# Patient Record
Sex: Male | Born: 1975 | Race: White | Hispanic: No | Marital: Married | Smoking: Current every day smoker
Health system: Southern US, Community
[De-identification: ages and names within clinical notes are randomized; demographics above are authoritative.]

## PROBLEM LIST (undated history)

## (undated) DIAGNOSIS — R51 Headache: Secondary | ICD-10-CM

## (undated) DIAGNOSIS — K219 Gastro-esophageal reflux disease without esophagitis: Secondary | ICD-10-CM

## (undated) DIAGNOSIS — E669 Obesity, unspecified: Secondary | ICD-10-CM

## (undated) DIAGNOSIS — I10 Essential (primary) hypertension: Secondary | ICD-10-CM

## (undated) DIAGNOSIS — F17200 Nicotine dependence, unspecified, uncomplicated: Secondary | ICD-10-CM

## (undated) DIAGNOSIS — F419 Anxiety disorder, unspecified: Secondary | ICD-10-CM

## (undated) DIAGNOSIS — R740 Nonspecific elevation of levels of transaminase and lactic acid dehydrogenase [LDH]: Secondary | ICD-10-CM

## (undated) DIAGNOSIS — E785 Hyperlipidemia, unspecified: Secondary | ICD-10-CM

## (undated) DIAGNOSIS — R7401 Elevation of levels of liver transaminase levels: Secondary | ICD-10-CM

## (undated) DIAGNOSIS — E1165 Type 2 diabetes mellitus with hyperglycemia: Secondary | ICD-10-CM

## (undated) DIAGNOSIS — Z8669 Personal history of other diseases of the nervous system and sense organs: Secondary | ICD-10-CM

## (undated) DIAGNOSIS — R519 Headache, unspecified: Secondary | ICD-10-CM

## (undated) HISTORY — PX: NO PAST SURGERIES: SHX2092

## (undated) HISTORY — DX: Gastro-esophageal reflux disease without esophagitis: K21.9

## (undated) HISTORY — DX: Anxiety disorder, unspecified: F41.9

## (undated) HISTORY — DX: Elevation of levels of liver transaminase levels: R74.01

## (undated) HISTORY — DX: Nonspecific elevation of levels of transaminase and lactic acid dehydrogenase (ldh): R74.0

## (undated) HISTORY — DX: Nicotine dependence, unspecified, uncomplicated: F17.200

## (undated) HISTORY — DX: Headache, unspecified: R51.9

## (undated) HISTORY — DX: Personal history of other diseases of the nervous system and sense organs: Z86.69

## (undated) HISTORY — DX: Essential (primary) hypertension: I10

## (undated) HISTORY — DX: Hyperlipidemia, unspecified: E78.5

## (undated) HISTORY — DX: Obesity, unspecified: E66.9

## (undated) HISTORY — DX: Headache: R51

## (undated) HISTORY — DX: Type 2 diabetes mellitus with hyperglycemia: E11.65

---

## 2006-10-08 ENCOUNTER — Ambulatory Visit: Payer: Self-pay | Admitting: Internal Medicine

## 2009-10-13 DIAGNOSIS — Z8669 Personal history of other diseases of the nervous system and sense organs: Secondary | ICD-10-CM

## 2009-10-13 HISTORY — DX: Personal history of other diseases of the nervous system and sense organs: Z86.69

## 2011-12-28 ENCOUNTER — Emergency Department: Payer: Self-pay | Admitting: Internal Medicine

## 2011-12-28 LAB — COMPREHENSIVE METABOLIC PANEL
ALT: 78 U/L — AB (ref 10–40)
AST: 43 U/L
Albumin: 4
Alkaline Phosphatase: 69 U/L (ref 50–136)
BUN: 14 mg/dL (ref 7–18)
Bilirubin,Total: 0.3 mg/dL (ref 0.2–1.0)
Chloride: 104 mmol/L (ref 98–107)
Creatinine: 0.94 mg/dL (ref 0.60–1.30)
EGFR (African American): >60
Glucose: 106 mg/dL — ABNORMAL HIGH (ref 65–99)
Osmolality: 288 (ref 275–301)
Potassium: 3.5 mmol/L (ref 3.5–5.1)
SGPT (ALT): 78 U/L
Sodium: 144 mmol/L (ref 136–145)
Total Bilirubin: 0.3 mg/dL
Total Protein: 7.7 g/dL (ref 6.4–8.2)

## 2011-12-28 LAB — CBC
Hemoglobin: 15 g/dL (ref 13.5–17.5)
MCH: 32.6 pg (ref 26.0–34.0)
MCHC: 34.2 g/dL (ref 32.0–36.0)
Platelet: 216 10*3/uL (ref 150–440)
RBC: 4.6 10*6/uL (ref 4.40–5.90)
RDW: 13.1 % (ref 11.5–14.5)
WBC: 10.4 10*3/uL (ref 3.8–10.6)
platelet count: 216

## 2011-12-28 LAB — TROPONIN I: Troponin-I: <0.02 ng/mL

## 2011-12-31 ENCOUNTER — Ambulatory Visit (INDEPENDENT_AMBULATORY_CARE_PROVIDER_SITE_OTHER): Payer: BC Managed Care – PPO | Admitting: Family Medicine

## 2011-12-31 ENCOUNTER — Encounter: Payer: Self-pay | Admitting: Family Medicine

## 2011-12-31 VITALS — BP 140/100 | HR 84 | Temp 98.8°F | Ht 70.0 in | Wt 257.5 lb

## 2011-12-31 DIAGNOSIS — R0981 Nasal congestion: Secondary | ICD-10-CM | POA: Insufficient documentation

## 2011-12-31 DIAGNOSIS — R51 Headache: Secondary | ICD-10-CM

## 2011-12-31 DIAGNOSIS — J3489 Other specified disorders of nose and nasal sinuses: Secondary | ICD-10-CM

## 2011-12-31 DIAGNOSIS — I10 Essential (primary) hypertension: Secondary | ICD-10-CM

## 2011-12-31 DIAGNOSIS — K219 Gastro-esophageal reflux disease without esophagitis: Secondary | ICD-10-CM | POA: Insufficient documentation

## 2011-12-31 DIAGNOSIS — E669 Obesity, unspecified: Secondary | ICD-10-CM

## 2011-12-31 DIAGNOSIS — F17200 Nicotine dependence, unspecified, uncomplicated: Secondary | ICD-10-CM

## 2011-12-31 MED ORDER — AMLODIPINE BESYLATE 5 MG PO TABS: 5.0000 mg | ORAL_TABLET | Freq: Every day | ORAL | Status: DC

## 2011-12-31 MED ORDER — OMEPRAZOLE 40 MG PO CPDR: 40.0000 mg | DELAYED_RELEASE_CAPSULE | Freq: Every day | ORAL | Status: DC

## 2011-12-31 NOTE — Assessment & Plan Note (Signed)
Trial of antihistamine.  If not improving to let us know.

## 2011-12-31 NOTE — Assessment & Plan Note (Signed)
Encouraged continued cessation, discussed different methods. Doing well on his own.

## 2011-12-31 NOTE — Progress Notes (Signed)
Subjective:    Patient ID: Brian Watson, male    DOB: January 02, 1976, 36 y.o.   MRN: [id number]  HPI CC: new pt to establish  Sunday had gassy bloated feeling in chest (improved with belching) so went to Er to be evaluated.  BP up to 200 systolic, then dropped to 190/90.  Unable to stay for evaluation because had to go home and take care of children.  Awaiting ER records.  States normal EKG then.  Elevated BP - started noticing issues when started cutting back on smoking - about 3 wks ago.  Tends to get frequent headaches.  Never on BP meds in past.  Denies CP/tightness, SOB, leg swelling.  Reflux - 1 year h/o indigestion with gassiness, worse with eating right before bedtime.  Notes worse with marinara.  Usually controlled well with zantac bid scheduled.  Worsening sinus congestion over last 2-3 days.  Mainly rhinorrhea, some coryza.    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  Having episodes where feels "nervousness" that improves with removing himself from stressful situation and relaxing.    Stress and bad memories with coming to doctor's office as father was sick and didn't take care of himself, in and out of hospitals, died age 64yo.  H/o bell's palsy 2011 - saw Dr. Jenne Campus ENT, placed on steroids and sxs resolved.  Smoking - trying to quit, down to 4 cig/day.  Prior 1 ppd.  Motivated to quit 2/2 cost of cigarettes and extra premium he pays as a smoker.  Obesity - Body mass index is 36.95 kg/(m^2).  Tries to get good fruit intake.  Stays more active during summer.  Wife says he snores.  Some days sleeps well, other days light sleeping.  Preventative: No recent CPE. No recent blood work.  Caffeine: 1 cup coffee/day Lives with wife and 2 children and mother, 1 dog Occupation: self employed Holiday representative Edu: HS Activity: hunts, fishes, gardens Diet: tries to keep good diet - daily fruits/vegetables, good water intake,  at work poor diet.  Medications and allergies reviewed and updated in chart.  Past histories reviewed and updated if relevant as below. Patient Active Problem List  Diagnoses  . Generalized headaches  . Smoker  . HTN (hypertension)  . GERD (gastroesophageal reflux disease)  . Obesity   Past Medical History  Diagnosis Date  . Generalized headaches     frequent  . GERD (gastroesophageal reflux disease)   . HTN (hypertension)   . H/O Bell's palsy 2011  . Smoker     cutting back  . Obesity    No past surgical history on file. History  Substance Use Topics  . Smoking status: Current Everyday Smoker -- 0.3 packs/day    Types: Cigarettes  . Smokeless tobacco: Never Used  . Alcohol Use: Yes     Daily (2 drinks after work)   Family History  Problem Relation Age of Onset  . Hypertension Mother   . Hyperlipidemia Mother   . Diabetes Father   . Hyperlipidemia Father   . Hypertension Father   . Coronary artery disease Paternal Grandfather   . Stroke Neg Hx   . Cancer Neg Hx    No Known Allergies No current outpatient prescriptions on file prior to visit.   Review of Systems  Constitutional: Negative for fever, chills, activity change, appetite change, fatigue and unexpected weight change.  HENT: Negative for hearing loss and neck pain.   Respiratory: Negative for cough, chest  tightness, shortness of breath and wheezing.   Cardiovascular: Negative for chest pain, palpitations and leg swelling.  Gastrointestinal: Negative for nausea, vomiting, abdominal pain, diarrhea, constipation, blood in stool and abdominal distention.  Genitourinary: Negative for hematuria and difficulty urinating.  Musculoskeletal: Negative for myalgias and arthralgias.  Skin: Negative for rash.  Neurological: Positive for headaches. Negative for dizziness, seizures and syncope.  Hematological: Does not bruise/bleed easily.  Psychiatric/Behavioral: Negative for dysphoric mood. The patient is not  nervous/anxious.        Objective:   Physical Exam  Nursing note and vitals reviewed. Constitutional: He is oriented to person, place, and time. He appears well-developed and well-nourished. No distress.       obese  HENT:  Head: Normocephalic and atraumatic.  Right Ear: Hearing, tympanic membrane, external ear and ear canal normal.  Left Ear: Hearing, tympanic membrane, external ear and ear canal normal.  Nose: Rhinorrhea present. No mucosal edema. Right sinus exhibits no maxillary sinus tenderness and no frontal sinus tenderness. Left sinus exhibits no maxillary sinus tenderness and no frontal sinus tenderness.  Mouth/Throat: Uvula is midline, oropharynx is clear and moist and mucous membranes are normal. No oropharyngeal exudate, posterior oropharyngeal edema, posterior oropharyngeal erythema or tonsillar abscesses.  Eyes: Conjunctivae and EOM are normal. Pupils are equal, round, and reactive to light. No scleral icterus.  Neck: Normal range of motion. Neck supple. No thyromegaly present.  Cardiovascular: Normal rate, regular rhythm, normal heart sounds and intact distal pulses.   No murmur heard. Pulses:      Radial pulses are 2+ on the right side, and 2+ on the left side.  Pulmonary/Chest: Effort normal and breath sounds normal. No respiratory distress. He has no wheezes. He has no rales.  Musculoskeletal: Normal range of motion. He exhibits no edema.  Lymphadenopathy:    He has no cervical adenopathy.  Neurological: He is alert and oriented to person, place, and time.       CN grossly intact, station and gait intact  Skin: Skin is warm and dry. No rash noted.  Psychiatric: He has a normal mood and affect. His behavior is normal. Judgment and thought content normal.       Assessment & Plan:

## 2011-12-31 NOTE — Assessment & Plan Note (Signed)
Encouraged continued diet changes, lifestyle changes to bring about sustainable weight loss. Discussed goal 5-10% weight loss for overall improvement in health. Pt motivated to work on this - goal 220lbs

## 2011-12-31 NOTE — Assessment & Plan Note (Addendum)
Relatively new dx. Several elevated readings, some up to 200s systolic (at [facility] ER).  Given elevated again today, start amlodipine at 5mg  daily - discussed common side effects. Pending records to review and will scan EKG if good quality. Will input labs from [facility] as well and see what is needed to draw at next visit. Discussed importance of weight loss and staying active and high potasium diet. rtc 1 mo for f/u.  ADDENDUM==> received records from [facility] eval - EKG NSR, CXR WNL, just received report of EKG not actual EKG, will ask to input labs - overall normal (glu 106, AST 43.) will rpt EKG at [facility] for baseline.

## 2011-12-31 NOTE — Assessment & Plan Note (Signed)
Discussed gerd precautions. Start prilosec daily for 3-4 wks, then reassess.

## 2011-12-31 NOTE — Patient Instructions (Addendum)
Return in 2 weeks for fasting lab visit, in 1 month for follow up blood pressure and reflux. Start amlodipine 5mg  for blood pressure. Start prilosec 40mg  daily for reflux for 3-4 weeks. For sinuses - I recommend claritin or zyrtec to dry up runny nose.  If not improving let me know. Good to meet you today, call us with questions.

## 2012-01-07 ENCOUNTER — Encounter: Payer: Self-pay | Admitting: Family Medicine

## 2012-01-13 ENCOUNTER — Other Ambulatory Visit: Payer: Self-pay | Admitting: Family Medicine

## 2012-01-13 DIAGNOSIS — I10 Essential (primary) hypertension: Secondary | ICD-10-CM

## 2012-01-14 ENCOUNTER — Other Ambulatory Visit (INDEPENDENT_AMBULATORY_CARE_PROVIDER_SITE_OTHER): Payer: BC Managed Care – PPO

## 2012-01-14 DIAGNOSIS — I10 Essential (primary) hypertension: Secondary | ICD-10-CM

## 2012-01-14 LAB — LIPID PANEL: HDL: 41.1 mg/dL (ref >39.00)

## 2012-01-14 LAB — LDL CHOLESTEROL, DIRECT: Direct LDL: 126.5 mg/dL

## 2012-01-19 ENCOUNTER — Ambulatory Visit (INDEPENDENT_AMBULATORY_CARE_PROVIDER_SITE_OTHER): Payer: BC Managed Care – PPO | Admitting: Family Medicine

## 2012-01-19 ENCOUNTER — Telehealth: Payer: Self-pay | Admitting: Family Medicine

## 2012-01-19 ENCOUNTER — Encounter: Payer: Self-pay | Admitting: Family Medicine

## 2012-01-19 VITALS — BP 148/98 | HR 96 | Temp 98.7°F | Wt 249.2 lb

## 2012-01-19 DIAGNOSIS — G479 Sleep disorder, unspecified: Secondary | ICD-10-CM

## 2012-01-19 DIAGNOSIS — G4733 Obstructive sleep apnea (adult) (pediatric): Secondary | ICD-10-CM | POA: Insufficient documentation

## 2012-01-19 DIAGNOSIS — I10 Essential (primary) hypertension: Secondary | ICD-10-CM

## 2012-01-19 DIAGNOSIS — G478 Other sleep disorders: Secondary | ICD-10-CM

## 2012-01-19 DIAGNOSIS — K7581 Nonalcoholic steatohepatitis (NASH): Secondary | ICD-10-CM | POA: Insufficient documentation

## 2012-01-19 DIAGNOSIS — E785 Hyperlipidemia, unspecified: Secondary | ICD-10-CM

## 2012-01-19 MED ORDER — AMLODIPINE BESYLATE 10 MG PO TABS: 10.0000 mg | ORAL_TABLET | Freq: Every day | ORAL | Status: DC

## 2012-01-19 NOTE — Progress Notes (Signed)
  Subjective:    Patient ID: Dezmond Downie, male    DOB: [birth year], 36 y.o.   MRN: [id number]  HPI CC: continued fatigue  last 1-2 wks trouble sleeping.  Tossing and turning.  Feels like constantly dreaming, light sleeping more than normal.  On average falls asleep in 30 min.  No trouble with staying asleep.  Bedtime around 10pm.  Heavy snoring, doesn't feel rested when awakens.  No noted apnea.  No PNdyspnea.  No daytime somnolence.  Possible excessive worrying, but mainly about recent HTN dx.  BP Readings from Last 3 Encounters:  01/19/12 148/98  12/31/11 140/100   Wt Readings from Last 3 Encounters:  01/19/12 249 lb 4 oz (113.059 kg)  12/31/11 257 lb 8 oz (116.801 kg)  extensive lifestyle changes over last 2 weeks.  Walking a lot more and regularly.  Down 10 lbs.  Only drinking water.  Rare sodas (prior was drinking 3 small bottles of mountain dew/day).  Stopped drinking cold [place] (prior drinking 3-4 mixed drinks or wine).  Smoking 2-4 cigarettes per day.  Changed diet from beef/pork to fish/chicken.  Review of Systems Per HPI    Objective:   Physical Exam  Nursing note and vitals reviewed. Constitutional: He appears well-developed and well-nourished. No distress.  HENT:  Head: Normocephalic and atraumatic.  Mouth/Throat: Oropharynx is clear and moist. No oropharyngeal exudate.  Eyes: Conjunctivae and EOM are normal. Pupils are equal, round, and reactive to light. No scleral icterus.  Neck: Normal range of motion. Neck supple. Carotid bruit is not present.  Cardiovascular: Normal rate, regular rhythm, normal heart sounds and intact distal pulses.   No murmur heard. Pulmonary/Chest: Breath sounds normal. No respiratory distress. He has no wheezes. He has no rales.  Musculoskeletal: He exhibits no edema.  Skin: Skin is warm and dry. No rash noted.       Assessment & Plan:

## 2012-01-19 NOTE — Telephone Encounter (Signed)
Caller: Chris/Patient; PCP: Eustaquio Boyden; CB#: [phone]; Call regarding Tired, Feels Drained, Occasionally "Overwhelmed"; Not Sleeping Well -"A lot of things on my mind."  Relates this has started since he changed his diet and exercise, medications.  Change in ability to perform activities of daily living and not previously evaluated.    Appt. w/ PCP at 1615 on 01/19/12.

## 2012-01-19 NOTE — Assessment & Plan Note (Signed)
With increased anxiousness, mild tachycardia x 1 wk. Correlates roughly to when stopped EtOH cold [place].  ?mild withdrawal. Recommend restart 1-2 drinks daily (was at 4+ drinks daily) and slowly taper down (pt wants to stop EtOH completely.) Doubt due to amlodipine.

## 2012-01-19 NOTE — Assessment & Plan Note (Signed)
Elevated ALT at ER. Recheck in future.  If staying elevated, further workup indicated.

## 2012-01-19 NOTE — Assessment & Plan Note (Signed)
Reviewed blood work in detail, discussed dietary changes for lowering cholesterol levels.

## 2012-01-19 NOTE — Patient Instructions (Signed)
Good to see you today. Return in 1 month (cancel appt 4/22), sooner if needed. Increase amlodipine to 5mg  twice daily, new prescription will be for 10 mg daily. Call us with questions.

## 2012-01-19 NOTE — Assessment & Plan Note (Signed)
Not at goal.  Increase amlodipine to 10mg  daily. Return in 34mo for f/u. Will need baseline EKG for our records in future. Reviewed blood work in detail.

## 2012-01-19 NOTE — Telephone Encounter (Signed)
I will see today

## 2012-01-28 ENCOUNTER — Ambulatory Visit (INDEPENDENT_AMBULATORY_CARE_PROVIDER_SITE_OTHER): Payer: BC Managed Care – PPO | Admitting: Family Medicine

## 2012-01-28 ENCOUNTER — Encounter: Payer: Self-pay | Admitting: Family Medicine

## 2012-01-28 ENCOUNTER — Telehealth: Payer: Self-pay | Admitting: Family Medicine

## 2012-01-28 VITALS — BP 138/90 | HR 72 | Temp 98.3°F | Wt 246.8 lb

## 2012-01-28 DIAGNOSIS — K219 Gastro-esophageal reflux disease without esophagitis: Secondary | ICD-10-CM

## 2012-01-28 DIAGNOSIS — R0789 Other chest pain: Secondary | ICD-10-CM

## 2012-01-28 NOTE — Assessment & Plan Note (Signed)
Likely source of atypical chest pain.  Reassuring history, better with pepto, no exertional sx, EGK w/o sig abnormality.  PAC noted on EGK.  No indication for further w/u at this point.  Work stress likely plays a role. Will notify PCP. If records not yet received from [facility], will request for PCP. Pt reassured.

## 2012-01-28 NOTE — Telephone Encounter (Signed)
Caller: Chris/Patient; PCP: Eustaquio Boyden; CB#: [phone]; ; Call regarding Acid Reflux; takes prilosec which was prescribed by MD but not helping very much with symptoms.  Pt wants to come back in and talk to MD about possibly switching to another medication.  Denies need for triage assessment.  No appts with Dr. Sharen Hones.  Appt scheduled for today at 2:15 PM with Dr. Para March per pt request.

## 2012-01-28 NOTE — Patient Instructions (Signed)
I wouldn't change your meds.  Take pepto as needed and keep working on your diet. I'll talk with Dr. Reece Agar in the meantime.  Take care.

## 2012-01-28 NOTE — Telephone Encounter (Signed)
Noted. Pt will see Dr. Algis Downs today.

## 2012-01-28 NOTE — Progress Notes (Signed)
H/o GERD.  Had been on H2 blocker prev, now on PPI.  Extensive lifestyle changes recently.  He has cut back to 5 cigs a day and had cut back to 2 drinks in a day.  His sleep is some better in the meantime.  Still with a sensation intermittently in his chest.  Can had chest pain at rest.  It got better with pepto bismol.  His father died at 78 but was chronically in poor healthy.    He doesn't have exertional sx. The feeling he notes now may have been present in a longer, but seems worse recently.  He's been walking more, up to 18 miles a week.  He doesn't have SOB or CP with that.    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    Meds, vitals, and allergies reviewed.   ROS: See HPI.  Otherwise, noncontributory.  GEN: nad, alert and oriented HEENT: mucous membranes moist NECK: supple w/o LA CV: rrr.  no murmur PULM: ctab, no inc wob ABD: soft, +bs EXT: no edema SKIN: no acute rash  EGK reviewed.

## 2012-02-02 ENCOUNTER — Ambulatory Visit: Payer: BC Managed Care – PPO | Admitting: Family Medicine

## 2012-02-13 ENCOUNTER — Ambulatory Visit: Payer: Self-pay | Admitting: Family Medicine

## 2012-02-18 ENCOUNTER — Ambulatory Visit (INDEPENDENT_AMBULATORY_CARE_PROVIDER_SITE_OTHER): Payer: BC Managed Care – PPO | Admitting: Family Medicine

## 2012-02-18 ENCOUNTER — Encounter: Payer: Self-pay | Admitting: Family Medicine

## 2012-02-18 VITALS — BP 136/82 | HR 76 | Temp 98.1°F | Wt 249.2 lb

## 2012-02-18 DIAGNOSIS — E785 Hyperlipidemia, unspecified: Secondary | ICD-10-CM

## 2012-02-18 DIAGNOSIS — K219 Gastro-esophageal reflux disease without esophagitis: Secondary | ICD-10-CM

## 2012-02-18 DIAGNOSIS — F17200 Nicotine dependence, unspecified, uncomplicated: Secondary | ICD-10-CM

## 2012-02-18 DIAGNOSIS — I10 Essential (primary) hypertension: Secondary | ICD-10-CM

## 2012-02-18 NOTE — Assessment & Plan Note (Addendum)
Encouraged continued cessation. Discussed nicotine gum. Pt thinks will buy e cig to try.

## 2012-02-18 NOTE — Assessment & Plan Note (Signed)
Recheck after 6 mo TLC - which he has taken to heart.

## 2012-02-18 NOTE — Assessment & Plan Note (Signed)
Chronic, stable. Good control - continue norvasc.

## 2012-02-18 NOTE — Assessment & Plan Note (Signed)
Elevated at hospital.  Next blood work next visit recheck.  If remains elevated, check abd [place].

## 2012-02-18 NOTE — Progress Notes (Signed)
  Subjective:    Patient ID: Brian Watson, male    DOB: [birth year], 36 y.o.   MRN: [id number]  HPI CC: 1 mo f/u  HTN - tolerating bp med well.  No leg or ankle swelling .  No HA, vision changes, CP/tightness, SOB, leg swelling.   Chest discomfort better - no more sharp pains.  Added maalox in middle of day.  Taking omeprazole 30 min prior to meal now.  Minimal GERD sxs. BP Readings from Last 3 Encounters:  02/18/12 136/82  01/28/12 138/90  01/19/12 148/98   Sticking with diet.  No beef, no pork.  Today ate fiber one, pistachios for snack, tuna on wheat bread.  Tonight baked fish and asparagus.  Changed to [place] bacon and sausage.    Wt Readings from Last 3 Encounters:  02/18/12 249 lb 4 oz (113.059 kg)  01/28/12 246 lb 12 oz (111.925 kg)  01/19/12 249 lb 4 oz (113.059 kg)   Having shoulder pain that started over weekend.  Started after carrying cooler full of fish.  Worse pain with lifting arm above head.  Taking ibuprofen for this and improving with time.  Lab Results  Component Value Date   ALT 78* 12/28/2011   AST 43 12/28/2011   ALKPHOS 69 12/28/2011   BILITOT 0.3 12/28/2011    Review of Systems Per HPI    Objective:   Physical Exam  Nursing note and vitals reviewed. Constitutional: He appears well-developed and well-nourished. No distress.  HENT:  Head: Normocephalic and atraumatic.  Mouth/Throat: Oropharynx is clear and moist. No oropharyngeal exudate.  Eyes: Conjunctivae and EOM are normal. Pupils are equal, round, and reactive to light. No scleral icterus.  Neck: Normal range of motion. Neck supple. Carotid bruit is not present.  Cardiovascular: Normal rate, regular rhythm, normal heart sounds and intact distal pulses.   No murmur heard. Pulmonary/Chest: Breath sounds normal. No respiratory distress. He has no wheezes. He has no rales.  Musculoskeletal:       No pain with palpation of shoulder. Mild tenderness with empty can sign on left.  Mild  tenderness with testing of SITS with internal rotation.  Skin: Skin is warm and dry. No rash noted.       Assessment & Plan:

## 2012-02-18 NOTE — Patient Instructions (Signed)
Keep an eye on blood pressure. Return in October for blood work fasting and afterwards for office visit to discuss. Continue meds as up to now, call us with quesitons. Great to see you today!

## 2012-02-18 NOTE — Assessment & Plan Note (Signed)
Thought contributing to chest pain, with better GERD control chest discomfort has resolved. Taking PPI daily 30 min prior to meals and maalox prn.

## 2012-03-09 ENCOUNTER — Ambulatory Visit (INDEPENDENT_AMBULATORY_CARE_PROVIDER_SITE_OTHER): Payer: BC Managed Care – PPO | Admitting: Family Medicine

## 2012-03-09 ENCOUNTER — Encounter: Payer: Self-pay | Admitting: Family Medicine

## 2012-03-09 VITALS — BP 124/90 | HR 76 | Temp 98.2°F | Wt 241.8 lb

## 2012-03-09 DIAGNOSIS — I10 Essential (primary) hypertension: Secondary | ICD-10-CM

## 2012-03-09 DIAGNOSIS — F419 Anxiety disorder, unspecified: Secondary | ICD-10-CM

## 2012-03-09 DIAGNOSIS — E785 Hyperlipidemia, unspecified: Secondary | ICD-10-CM

## 2012-03-09 DIAGNOSIS — F17200 Nicotine dependence, unspecified, uncomplicated: Secondary | ICD-10-CM

## 2012-03-09 DIAGNOSIS — R0789 Other chest pain: Secondary | ICD-10-CM

## 2012-03-09 DIAGNOSIS — G479 Sleep disorder, unspecified: Secondary | ICD-10-CM

## 2012-03-09 DIAGNOSIS — F411 Generalized anxiety disorder: Secondary | ICD-10-CM

## 2012-03-09 DIAGNOSIS — K219 Gastro-esophageal reflux disease without esophagitis: Secondary | ICD-10-CM

## 2012-03-09 DIAGNOSIS — G478 Other sleep disorders: Secondary | ICD-10-CM

## 2012-03-09 HISTORY — DX: Anxiety disorder, unspecified: F41.9

## 2012-03-09 MED ORDER — BUSPIRONE HCL 7.5 MG PO TABS: 7.5000 mg | ORAL_TABLET | Freq: Two times a day (BID) | ORAL | Status: DC

## 2012-03-09 NOTE — Patient Instructions (Addendum)
We will call you with referral to heart doctor and to lung doctor for sleep study. Start buspar at 7.5mg  nightly, may go up to 7.5mg  twice daily after 4-5 days if tolerated - watch for dizziness Return to see me in 1 month to see how buspar is doing.

## 2012-03-09 NOTE — Assessment & Plan Note (Addendum)
GAD7 = 15/21 Anticipate significant component of issues stemming from recent increase in anxiety. Discussed this, will treat with trial of buspar.  Discussed dizziness precautions and sedation on this med. Recommended heatlhy stress reducing strategies.

## 2012-03-09 NOTE — Progress Notes (Signed)
  Subjective:    Patient ID: Brian Watson, male    DOB: 12/17/1975, 36 y.o.   MRN: [id number]  HPI CC: chest pain, sleep issues  Presents with wife.  Trouble sleeping for last 2 wks (prior improved then deteriorated again).  Initially thought doing better, but actually now worse.  Went camping this weekend.  Friends were surprised at how loud he was snoring.  Wife hasn't really noticed becaise she "sleeps through anything".  Tossing and turning.  On average falls asleep in 30 min. Waking up in middle of night.  Trouble staying asleep.  Bedtime around 10pm.  Heavy snoring, doesn't feel rested when awakens.  No noted apnea according to wife.  No PNdyspnea. Recent increase in daytime somnolence. Possible excessive worrying, but mainly about recent HTN dx and work worries.  Uses 2 pillows at night.  Chest pain - on and off through front of chest.  Continues intermittent.  Worse with anxiety.  Resolves with calming down.  Endorsed pressure sensation sub-sternally.  No nausea or diaphoresis.  No radiation of pain.  Able to work out on elliptical and walk outside without chest pain.  One episode of heart beating fast.  Occurred a few weeks back.  No dizziness with this.  Denies SOB, leg swelling, HA, dizziness.  1/3 ppd smoking.(cutting back)  GERD - controlled on omeprazole and maalox.  Wife worried pt has anxiety.  Lots of stress at work.  Wt Readings from Last 3 Encounters:  03/09/12 241 lb 12 oz (109.657 kg)  02/18/12 249 lb 4 oz (113.059 kg)  01/28/12 246 lb 12 oz (111.925 kg)   significant healthy lifestyle changes over last few months.  20 lb weight loss over last few months  Past Medical History  Diagnosis Date  . Generalized headaches     frequent  . GERD (gastroesophageal reflux disease)   . HTN (hypertension)   . H/O Bell's palsy 2011  . Smoker     cutting back  . Obesity   . Dyslipidemia   . Transaminitis     Family History  Problem Relation Age of Onset  .  Hypertension Mother   . Hyperlipidemia Mother   . Diabetes Father   . Hyperlipidemia Father   . Hypertension Father   . Coronary artery disease Paternal Grandfather   . Stroke Neg Hx   . Cancer Neg Hx   . Coronary artery disease Brother     Review of Systems Per HPI    Objective:   Physical Exam  Nursing note and vitals reviewed. Constitutional: He appears well-developed and well-nourished. No distress.  HENT:  Head: Normocephalic and atraumatic.  Mouth/Throat: Oropharynx is clear and moist. No oropharyngeal exudate.  Eyes: Conjunctivae and EOM are normal. Pupils are equal, round, and reactive to light. No scleral icterus.  Neck: Normal range of motion. Neck supple. Carotid bruit is not present.  Cardiovascular: Normal rate, regular rhythm, normal heart sounds and intact distal pulses.   No murmur heard. Pulmonary/Chest: Breath sounds normal. No respiratory distress. He has no wheezes. He has no rales.  Musculoskeletal: He exhibits no edema.  Skin: Skin is warm and dry. No rash noted.  Psychiatric: He has a normal mood and affect.       Assessment & Plan:

## 2012-03-10 ENCOUNTER — Encounter: Payer: Self-pay | Admitting: Family Medicine

## 2012-03-10 NOTE — Assessment & Plan Note (Addendum)
Some concerning features of discomfort as well as risk factors (smoker, obesity, fmhx CAD, HLD) however overall low likelihood of cardiac cause. EKG overall normal when last done. However given ongoing for last several months despite treatment of GERD, will refer to cards for further evaluation, consideration of ETT. In meantime, treat anxiety.

## 2012-03-10 NOTE — Assessment & Plan Note (Signed)
stable °

## 2012-03-10 NOTE — Assessment & Plan Note (Signed)
Currently pursuing TLC - recheck in 3-5 months

## 2012-03-10 NOTE — Assessment & Plan Note (Signed)
Several concerning sxs for sleep apnea - will refer to pulmonology for sleep evaluation. Encouraged continued weight loss.

## 2012-03-10 NOTE — Assessment & Plan Note (Signed)
BP Readings from Last 3 Encounters:  03/09/12 124/90  02/18/12 136/82  01/28/12 138/90  one elevated reading at hospital - stable on norvasc.

## 2012-03-10 NOTE — Assessment & Plan Note (Signed)
Continue to encourage cessation. 

## 2012-03-11 ENCOUNTER — Telehealth: Payer: Self-pay | Admitting: Pulmonary Disease

## 2012-03-11 NOTE — Telephone Encounter (Signed)
Rescheduled pt for 5/31 with KC.Brian Watson

## 2012-03-11 NOTE — Telephone Encounter (Signed)
Per CY-he is not able to take patient on and please have Lawson Fiscal work with [facility] to see if patient can be seen sooner.

## 2012-03-11 NOTE — Telephone Encounter (Signed)
[id number] x1 

## 2012-03-11 NOTE — Telephone Encounter (Signed)
Lawson Fiscal and [facility] please advise if pt can be worked in sooner, thanks

## 2012-03-11 NOTE — Telephone Encounter (Signed)
Returning call.Brian Watson ° °

## 2012-03-11 NOTE — Telephone Encounter (Signed)
I spoke with spouse and pt is scheduled to see [facility] for sleep consult on 03/31/12. Spouse is requesting to have pt worked in with another provider sooner. She states he is not sleeping at all and is only getting worse. Dr. Maple Hudson please advise if pt can be worked in sooner on your schedule, thanks

## 2012-03-12 ENCOUNTER — Ambulatory Visit (INDEPENDENT_AMBULATORY_CARE_PROVIDER_SITE_OTHER): Payer: BC Managed Care – PPO | Admitting: Pulmonary Disease

## 2012-03-12 ENCOUNTER — Encounter: Payer: Self-pay | Admitting: Pulmonary Disease

## 2012-03-12 VITALS — BP 136/82 | HR 71 | Temp 98.4°F | Ht 70.0 in | Wt 240.2 lb

## 2012-03-12 DIAGNOSIS — G479 Sleep disorder, unspecified: Secondary | ICD-10-CM

## 2012-03-12 DIAGNOSIS — G4733 Obstructive sleep apnea (adult) (pediatric): Secondary | ICD-10-CM

## 2012-03-12 NOTE — Patient Instructions (Signed)
Will set up for home sleep testing.  Will arrange followup once results are available Work on weight loss.  

## 2012-03-12 NOTE — Assessment & Plan Note (Signed)
The patient's history is very suggestive of clinically significant sleep apnea.  I had a long discussion with he and his wife regarding the pathophysiology of sleep apnea, including its impact to his quality of life and cardiovascular health.  I would highly recommend proceeding with a sleep study, and the patient is agreeable to this approach.  I also encouraged him to work aggressively on weight loss.

## 2012-03-12 NOTE — Progress Notes (Signed)
  Subjective:    Patient ID: Brian Watson, male    DOB: January 31, 1976, 36 y.o.   MRN: [id number]  HPI The patient is a 36 year old male who I've been asked to see for possible obstructive sleep apnea.  He has been noted to have loud snoring, as well as an abnormal breathing pattern during sleep.  He has noticed frequent awakenings, as well as a fluttering in his chest at night.  He is not rested in the mornings upon arising, but denies significant sleep pressure because of his very busy day.  He does have some sleepiness in the evenings while watching television, especially on weekends.  He also notes some sleepiness with driving longer distances.  The patient states that his weight is up 40 pounds over the last 10 years, but most recently  he has lost 20 pounds.  His Epworth sleepiness score today is abnormal at 11.  Sleep Questionnaire: What time do you typically go to bed?( Between what hours) 10 pm How long does it take you to fall asleep? varies How many times during the night do you wake up? 3 What time do you get out of bed to start your day? 0530 Do you drive or operate heavy machinery in your occupation? Yes How much has your weight changed (up or down) over the past two years? (In pounds) 10 lb (4.536 kg) Have you ever had a sleep study before? No Do you currently use CPAP? No Do you wear oxygen at any time? No    Review of Systems  Constitutional: Positive for unexpected weight change. Negative for fever.  HENT: Negative.  Negative for ear pain, nosebleeds, congestion, sore throat, rhinorrhea, sneezing, trouble swallowing, dental problem, postnasal drip and sinus pressure.   Eyes: Negative.  Negative for redness and itching.  Respiratory: Negative for cough, chest tightness, shortness of breath and wheezing.   Cardiovascular: Positive for chest pain. Negative for palpitations and leg swelling.  Gastrointestinal: Negative for nausea and vomiting.       Heartburn\indigestion     Genitourinary: Negative.  Negative for dysuria.  Musculoskeletal: Negative.  Negative for joint swelling.  Skin: Negative.  Negative for rash.  Neurological: Negative.  Negative for headaches.  Hematological: Negative.  Does not bruise/bleed easily.  Psychiatric/Behavioral: Negative for dysphoric mood. The patient is nervous/anxious.        Objective:   Physical Exam Constitutional:  Obese male,  no acute distress  HENT:  Nares patent without discharge, deviation to left with narrowing  Oropharynx without exudate, palate and uvula are thick and elongated.   Eyes:  Perrla, eomi, no scleral icterus  Neck:  No JVD, no TMG  Cardiovascular:  Normal rate, regular rhythm, no rubs or gallops.  No murmurs        Intact distal pulses  Pulmonary :  Normal breath sounds, no stridor or respiratory distress   No rales, rhonchi, or wheezing  Abdominal:  Soft, nondistended, bowel sounds present.  No tenderness noted.   Musculoskeletal:  No lower extremity edema noted.  Lymph Nodes:  No cervical lymphadenopathy noted  Skin:  No cyanosis noted  Neurologic:  Appears sleepy, appropriate, moves all 4 extremities without obvious deficit.         Assessment & Plan:

## 2012-03-15 ENCOUNTER — Emergency Department (HOSPITAL_COMMUNITY): Payer: BC Managed Care – PPO

## 2012-03-15 ENCOUNTER — Emergency Department (HOSPITAL_COMMUNITY)
Admission: EM | Admit: 2012-03-15 | Discharge: 2012-03-15 | Disposition: A | Discharge status: Home / Self Care | Payer: BC Managed Care – PPO | Attending: Emergency Medicine | Admitting: Emergency Medicine

## 2012-03-15 ENCOUNTER — Telehealth: Payer: Self-pay | Admitting: Pulmonary Disease

## 2012-03-15 ENCOUNTER — Encounter (HOSPITAL_COMMUNITY): Payer: Self-pay | Admitting: Emergency Medicine

## 2012-03-15 ENCOUNTER — Other Ambulatory Visit: Payer: Self-pay | Admitting: Pulmonary Disease

## 2012-03-15 DIAGNOSIS — K219 Gastro-esophageal reflux disease without esophagitis: Secondary | ICD-10-CM | POA: Insufficient documentation

## 2012-03-15 DIAGNOSIS — R079 Chest pain, unspecified: Secondary | ICD-10-CM | POA: Insufficient documentation

## 2012-03-15 DIAGNOSIS — E785 Hyperlipidemia, unspecified: Secondary | ICD-10-CM | POA: Insufficient documentation

## 2012-03-15 DIAGNOSIS — G4733 Obstructive sleep apnea (adult) (pediatric): Secondary | ICD-10-CM

## 2012-03-15 DIAGNOSIS — R0789 Other chest pain: Secondary | ICD-10-CM

## 2012-03-15 DIAGNOSIS — F17200 Nicotine dependence, unspecified, uncomplicated: Secondary | ICD-10-CM | POA: Insufficient documentation

## 2012-03-15 DIAGNOSIS — I10 Essential (primary) hypertension: Secondary | ICD-10-CM | POA: Insufficient documentation

## 2012-03-15 LAB — POCT I-STAT TROPONIN I: Troponin i, poc: 0 ng/mL (ref 0.00–0.08)

## 2012-03-15 LAB — DIFFERENTIAL
Basophils Absolute: 0.1 10*3/uL (ref 0.0–0.1)
Lymphocytes Relative: 21 % (ref 12–46)
Lymphs Abs: 2.3 10*3/uL (ref 0.7–4.0)
Monocytes Absolute: 0.8 10*3/uL (ref 0.1–1.0)
Neutro Abs: 7.7 10*3/uL (ref 1.7–7.7)

## 2012-03-15 LAB — CBC
HCT: 46.5 % (ref 39.0–52.0)
RBC: 5.05 MIL/uL (ref 4.22–5.81)
RDW: 13.6 % (ref 11.5–15.5)
WBC: 10.9 10*3/uL — ABNORMAL HIGH (ref 4.0–10.5)

## 2012-03-15 LAB — POCT I-STAT, CHEM 8
BUN: 16 mg/dL (ref 6–23)
Calcium, Ion: 1.24 mmol/L (ref 1.12–1.32)
Chloride: 104 mEq/L (ref 96–112)
Sodium: 139 mEq/L (ref 135–145)

## 2012-03-15 NOTE — Telephone Encounter (Signed)
Please advise if ok to change sleep study to the hospital per pt's request

## 2012-03-15 NOTE — ED Provider Notes (Signed)
History     CSN: [id number]  Arrival date & time 03/15/12  1249   First MD Initiated Contact with Patient 03/15/12 1511      Chief Complaint  Patient presents with  . Chest Pain    (Consider location/radiation/quality/duration/timing/severity/associated sxs/prior treatment) The history is provided by the patient.  36 y/o M with PMH HTN, mild HLD, GERD, ?sleep apnea presents to ED with c/c of left chest discomfort for the last 6-8 weeks. Non-radiating, described as "an air pocket", mild-mod intensity. Lasts approx 1 minute each time, occurs from 1-5 x/day. Not brought on by exercise, of which he has had increased frequency and intensity in the last month. No assoc fever, SOB, nausea, cough. No discernible aggravating or alleviating factors. Has been taking various reflux medications as directed by PCP with questionable improvement. Has seen PCP multiple times since pain began, has appt set up with cardiology for eval on June 19. Pertinent issues also include poor sleep, which has been worsening and has ?assoc with stress from work vs OSA. Had sleep specialist consult last week, supposed to have sleep study in the next week. Also started taking buspar approx 1 week ago with worsened grogginess, but no confusion. No known personal hx CAD. FH CAD, DM, HTN.   Past Medical History  Diagnosis Date  . Generalized headaches     frequent  . GERD (gastroesophageal reflux disease)   . HTN (hypertension)   . H/O Bell's palsy 2011  . Smoker     cutting back  . Obesity   . Dyslipidemia   . Transaminitis     Past Surgical History  Procedure Date  . No past surgeries     Family History  Problem Relation Age of Onset  . Hypertension Mother   . Hyperlipidemia Mother   . Diabetes Father   . Hyperlipidemia Father   . Hypertension Father   . Coronary artery disease Paternal Grandfather   . Stroke Neg Hx   . Cancer Neg Hx   . Coronary artery disease Father 46    smoking, severe diabetes  .  Coronary artery disease Paternal Uncle     History  Substance Use Topics  . Smoking status: Current Everyday Smoker -- 0.5 packs/day for 14 years    Types: Cigarettes  . Smokeless tobacco: Never Used  . Alcohol Use: Yes     Daily (2 drinks after work)      Review of Systems 10 systems reviewed and are negative for acute change except as noted in the HPI.  Allergies  Review of patient's allergies indicates no known allergies.  Home Medications   Current Outpatient Rx  Name Route Sig Dispense Refill  . AMLODIPINE BESYLATE 10 MG PO TABS Oral Take 1 tablet (10 mg total) by mouth daily. 90 tablet 3  . BUSPIRONE HCL 7.5 MG PO TABS Oral Take 1 tablet (7.5 mg total) by mouth 2 (two) times daily. 60 tablet 3  . IBUPROFEN 200 MG PO TABS Oral Take 200 mg by mouth as needed.    Marland Kitchen OMEPRAZOLE 40 MG PO CPDR Oral Take 1 capsule (40 mg total) by mouth daily. 30 capsule 3    BP 145/88  Pulse 86  Temp(Src) 98 F (36.7 C) (Oral)  Resp 19  SpO2 98%  Physical Exam  Nursing note reviewed. Constitutional: He is oriented to person, place, and time. He appears well-developed and well-nourished. No distress.       VS reviewed, sig for mild HTN  HENT:  Head: Normocephalic and atraumatic.  Right Ear: External ear normal.  Left Ear: External ear normal.  Nose: Nose normal.  Mouth/Throat: Oropharynx is clear and moist.  Eyes: Conjunctivae are normal. Pupils are equal, round, and reactive to light.  Neck: Neck supple.       No bruit heard  Cardiovascular: Normal rate, regular rhythm and normal heart sounds.   No murmur heard.      Bilateral radial and DP pulses are 2+   Pulmonary/Chest: Effort normal and breath sounds normal. No respiratory distress. He has no wheezes. He has no rales. He exhibits no tenderness.  Abdominal: Soft. Bowel sounds are normal. There is no tenderness.       Obese  Musculoskeletal:       Calves supple and non-tender  Neurological: He is alert and oriented to  person, place, and time. Cranial nerve deficit: 3-12 intact.       MS appears appropriate for pt and situation. MAEW with no gross motor or neuro deficit.  Skin: Skin is warm and dry.  Psychiatric: He has a normal mood and affect.    ED Course  Procedures (including critical care time)  Labs Reviewed  CBC - Abnormal; Notable for the following:    WBC 10.9 (*)    All other components within normal limits  POCT I-STAT, CHEM 8 - Abnormal; Notable for the following:    Glucose, Bld 106 (*)    All other components within normal limits  DIFFERENTIAL  POCT I-STAT TROPONIN I   Dg Chest 2 View  03/15/2012  *RADIOLOGY REPORT*  Clinical Data: Chest pain  CHEST - 2 VIEW  Comparison: None.  Findings: Normal heart size.  Bronchitic changes.  Otherwise clear lungs.  No pneumothorax or pleural effusion.  IMPRESSION: Bronchitic changes.  Original Report Authenticated By: Donavan Burnet, M.D.   , Date: 03/15/2012  Rate: 84  Rhythm: sinus arrhythmia  QRS Axis: normal  Intervals: normal  ST/T Wave abnormalities: normal  Conduction Disutrbances:none  Old EKG Reviewed: unchanged, prior dated 01/28/2012    Dx 1: Chest pain   MDM  Atypical CP in young male pt, RF HTN, HLD, FH. No exertional component. Unchanged EKG. Neg troponin, other labs unremarkable. Discussed with pt doubtful cardiac etiology, all of today's results. Advised f.u with cardiology and sleep study as previously planned.  Regarding sleep, no ED workup necessary given planned f/u. Regarding grogginess since starting buspar, spoke with pt and family about f/u with PCP. No gross motor or neuro findings on exam, question medication initiation reaction vs sleep disturbance worsening.        Shaaron Adler, PA-C 03/15/12 1553

## 2012-03-15 NOTE — ED Notes (Addendum)
Pt states he has been having CP for 6-8 weeks and has been to MD 5 or 6 times. States that he is here because he is "feeling loopy." Denies radiation, back pain, jaw pain. Denies pain in other locations. At this time, pain is 1/10. States pain "comes and goes."  MD gave Rx for heartburn. Then MD increased BP meds. Then MD "thought is was some kind of mid-life thing." Then MD sent Pt for sleep study and gave Rx for anxiolytic.  Pt in NSR at this time.

## 2012-03-15 NOTE — ED Notes (Signed)
Onset few months ago chest pain seen primary Doctor. Continued to have chest pain intermittently States unable to sleep recently tired seen sleep specialist 3 days ago.  Chest pain now 0/10.

## 2012-03-15 NOTE — Telephone Encounter (Signed)
Called and spoke with pts wife and she is aware that order has been placed to have sleep study done at the hospital---wife is requesting that this be scheduled ASAP.  Pt is currently in the ER to r/o problems with his heart.  She is aware that we will call her with the appt.

## 2012-03-15 NOTE — ED Provider Notes (Signed)
Medical screening examination/treatment/procedure(s) were performed by non-physician practitioner and as supervising physician I was immediately available for consultation/collaboration.  Discussed case with the mid-level patient has had chest pain for several days initial troponins negative also is being worked up by his primary care Dr. and has 2 referred to cardiology for consultation as well sleep studies. Today no evidence of an acute cardiac event patient is stable can be discharged home.  Shelda Jakes, MD 03/15/12 1600

## 2012-03-15 NOTE — Telephone Encounter (Signed)
Ok with me.  Needs SLE3

## 2012-03-15 NOTE — Discharge Instructions (Signed)
As we discussed, your chest x-ray showed no specific findings. There were no changes on your EKG. Your labs showed no concerning results or evidence of recent heart damage. Follow-up with your doctors.     Chest Pain (Nonspecific) Chest pain has many causes. Your pain could be caused by something serious, such as a heart attack or a blood clot in the lungs. It could also be caused by something less serious, such as a chest bruise or a virus. Follow up with your doctor. More lab tests or other studies may be needed to find the cause of your pain. Most of the time, nonspecific chest pain will improve within 2 to 3 days of rest and mild pain medicine. HOME CARE  For chest bruises, you may put ice on the sore area for 15 to 20 minutes, 3 to 4 times a day. Do this only if it makes you or your child feel better.   Put ice in a plastic bag.   Place a towel between the skin and the bag.   Rest for the next 2 to 3 days.   Go back to work if the pain improves.   See your doctor if the pain lasts longer than 1 to 2 weeks.   Only take medicine as told by your doctor.   Quit smoking if you smoke.  GET HELP RIGHT AWAY IF:   There is more pain or pain that spreads to the arm, neck, jaw, back, or belly (abdomen).   You or your child has shortness of breath.   You or your child coughs more than usual or coughs up blood.   You or your child has very bad back or belly pain, feels sick to his or her stomach (nauseous), or throws up (vomits).   You or your child has very bad weakness.   You or your child passes out (faints).   You or your child has a temperature by mouth above 102 F (38.9 C), not controlled by medicine.  Any of these problems may be serious and may be an emergency. Do not wait to see if the problems will go away. Get medical help right away. Call your local emergency services 911 in [place] Do not drive yourself to the hospital. MAKE SURE YOU:   Understand these instructions.     Will watch this condition.   Will get help right away if you or your child is not doing well or gets worse.  Document Released: 03/17/2008 Document Revised: 09/18/2011 Document Reviewed: 03/17/2008 [facility] Patient Information 2012 [place], [place].        Sleep Apnea Sleep apnea is a common disorder. The main problem of this disorder is excessive daytime sleepiness and compromised quality of life. This may include social and emotional problems. There are two types of sleep apnea.  Obstructive sleep apnea is when breathing stops due to a blocked airway.   Central sleep apnea is a malfunction of the brain's normal signal to breathe.  SYMPTOMS  Restless sleep.   Falling asleep while driving and/or during the day.   Loss of energy.   Irritability.   Mood or behavior changes.   Loud, heavy snoring.   Morning headaches.   Trouble concentrating.   Forgetfulness.   Anxiety or depression.   Decreased interest in sex.  Not all people with sleep apnea have all of these symptoms. However, people who have a few of these symptoms should visit their caregiver for an evaluation. Problems related to untreated sleep  apnea include:  High blood pressure (hypertension).   Coronary artery disease.   Impotence.   Cognitive dysfunction.   Memory loss.  TREATMENT  For mild cases, treatment may include avoiding sleeping on one's back.   For people with nasal congestion, a decongestant may be prescribed.   Patients with obstructive and central apnea should avoid depressants. This includes alcohol, sedatives and narcotics. Weight loss and diet control are encouraged for overweight patients.   Many serious cases of obstructive sleep apnea can be relieved by a treatment called nasal continuous positive airway pressure (nasal CPAP). Nasal CPAP uses a mask-like device and pump that work together to keep the airway open. The pump delivers air pressure during each breath.   Surgery  may help some patients by stopping or reducing the narrowing of the airway due to anatomical defects.  PROGNOSIS  Removing the obstruction usually reverses hypertension and cardiac problems. Untreated, sleep apnea sufferers have a tendency to fall asleep during the day. This is can result in serious accident or loss of ones job. RESEARCH Sleep apnea is currently one of the most active areas of sleep research.  Document Released: 09/19/2002 Document Revised: 09/18/2011 Document Reviewed: 01/15/2006 [facility] Patient Information 2012 [place], [place].         Buspirone tablets What is this medicine? BUSPIRONE (byoo SPYE rone) is used to treat anxiety disorders. This medicine may be used for other purposes; ask your health care provider or pharmacist if you have questions. What should I tell my health care provider before I take this medicine? They need to know if you have any of these conditions: -kidney or liver disease -an unusual or allergic reaction to buspirone, other medicines, foods, dyes, or preservatives -pregnant or trying to get pregnant -breast-feeding How should I use this medicine? Take this medicine by mouth with a glass of water. Follow the directions on the prescription label. You may take this medicine with or without food. To ensure that this medicine always works the same way for you, you should take it either always with or always without food. Take your doses at regular intervals. Do not take your medicine more often than directed. Do not stop taking except on the advice of your doctor or health care professional. Talk to your pediatrician regarding the use of this medicine in children. Special care may be needed. Overdosage: If you think you have taken too much of this medicine contact a poison control center or emergency room at once. NOTE: This medicine is only for you. Do not share this medicine with others. What if I miss a dose? If you miss a dose, take it as  soon as you can. If it is almost time for your next dose, take only that dose. Do not take double or extra doses. What may interact with this medicine? Do not take this medicine with any of the following medications: -linezolid -MAOIs like Carbex, Eldepryl, Marplan, Nardil, and Parnate -methylene blue -procarbazine This medicine may also interact with the following medications: -diazepam -digoxin -diltiazem -erythromycin -grapefruit juice -haloperidol -medicines for mental depression or mood problems -medicines for seizures like carbamazepine, phenobarbital and phenytoin -nefazodone -other medications for anxiety -rifampin -ritonavir -some antifungal medicines like itraconazole, ketoconazole, and voriconazole -verapamil -warfarin This list may not describe all possible interactions. Give your health care provider a list of all the medicines, herbs, non-prescription drugs, or dietary supplements you use. Also tell them if you smoke, drink alcohol, or use illegal drugs. Some items may interact with  your medicine. What should I watch for while using this medicine? Visit your doctor or health care professional for regular checks on your progress. It may take 1 to 2 weeks before your anxiety gets better. You may get drowsy or dizzy. Do not drive, use machinery, or do anything that needs mental alertness until you know how this drug affects you. Do not stand or sit up quickly, especially if you are an older patient. This reduces the risk of dizzy or fainting spells. Alcohol can make you more drowsy and dizzy. Avoid alcoholic drinks. What side effects may I notice from receiving this medicine? Side effects that you should report to your doctor or health care professional as soon as possible: -blurred vision or other vision changes -chest pain -confusion -difficulty breathing -feelings of hostility or anger -muscle aches and pains -numbness or tingling in hands or feet -ringing in the  ears -skin rash and itching -vomiting -weakness Side effects that usually do not require medical attention (report to your doctor or health care professional if they continue or are bothersome): -disturbed dreams, nightmares -headache -nausea -restlessness or nervousness -sore throat and nasal congestion -stomach upset This list may not describe all possible side effects. Call your doctor for medical advice about side effects. You may report side effects to FDA at 1-800-FDA-1088. Where should I keep my medicine? Keep out of the reach of children. Store at room temperature below 30 degrees C (86 degrees F). Protect from light. Keep container tightly closed. Throw away any unused medicine after the expiration date. NOTE: This sheet is a summary. It may not cover all possible information. If you have questions about this medicine, talk to your doctor, pharmacist, or health care provider.  2012, [employer]vier/[employer] Standard. (05/09/2010 6:06:11 PM)

## 2012-03-17 ENCOUNTER — Ambulatory Visit (INDEPENDENT_AMBULATORY_CARE_PROVIDER_SITE_OTHER): Payer: BC Managed Care – PPO | Admitting: Family Medicine

## 2012-03-17 ENCOUNTER — Encounter: Payer: Self-pay | Admitting: Family Medicine

## 2012-03-17 VITALS — BP 118/80 | HR 60 | Temp 97.9°F | Wt 239.0 lb

## 2012-03-17 DIAGNOSIS — G479 Sleep disorder, unspecified: Secondary | ICD-10-CM

## 2012-03-17 DIAGNOSIS — F411 Generalized anxiety disorder: Secondary | ICD-10-CM

## 2012-03-17 DIAGNOSIS — R0789 Other chest pain: Secondary | ICD-10-CM

## 2012-03-17 DIAGNOSIS — F419 Anxiety disorder, unspecified: Secondary | ICD-10-CM

## 2012-03-17 MED ORDER — TEMAZEPAM 15 MG PO CAPS: 15.0000 mg | ORAL_CAPSULE | Freq: Every evening | ORAL | Status: DC | PRN

## 2012-03-17 MED ORDER — SERTRALINE HCL 50 MG PO TABS: 50.0000 mg | ORAL_TABLET | Freq: Every day | ORAL | Status: DC

## 2012-03-17 NOTE — Progress Notes (Signed)
  Subjective:    Patient ID: Brian Watson, male    DOB: 11-09-1975, 36 y.o.   MRN: [id number]  HPI CC: continued issues with stress, anxiety, chest pain  buspar started for anxiety last visit, has not noticed any improvement.  Possibly worsening.  Doesn't feel safe driving.  Feels completely exhausted.  Sleeping 3-4 hours/night.  This has been present intermittently for last 3 months, but worse over last 1 1/2 wks.  Wife corroborates story, states he has not been himself especially over the last week.  Pt feels unable to function in current state.  Recently seen by pulmonology, scheduled sleep study tomorrow night to eval OSA.  Chest pain - on and off through front of chest. Continues intermittent. Worse with anxiety. Resolves with calming down. Endorsed pressure sensation sub-sternally. No nausea or diaphoresis. No radiation of pain. Able to work out on elliptical and walk outside without chest pain.  sxs led him to see care at [facility] ER 03/15/2012 - records reviewed:  CXR with bronchitic changes.  EKG WNL.  Cardiac enzyme neg x1.  12/12/2011 worked in [employer], wonders if got carbon monoxide exposure.  O2 sat 98% at ER on room air.  Past Medical History  Diagnosis Date  . Generalized headaches     frequent  . GERD (gastroesophageal reflux disease)   . HTN (hypertension)   . H/O Bell's palsy 2011  . Smoker     cutting back  . Obesity   . Dyslipidemia   . Transaminitis     Review of Systems Per HPI    Objective:   Physical Exam  Nursing note and vitals reviewed. Constitutional: He appears well-developed and well-nourished. No distress.  HENT:  Head: Normocephalic and atraumatic.  Mouth/Throat: No oropharyngeal exudate.  Eyes: Conjunctivae and EOM are normal. Pupils are equal, round, and reactive to light. No scleral icterus.  Neck: Normal range of motion. Neck supple. Carotid bruit is not present.  Cardiovascular: Normal rate, regular rhythm, normal heart sounds and  intact distal pulses.   No murmur heard. Pulmonary/Chest: Effort normal and breath sounds normal. No respiratory distress. He has no wheezes. He has no rales.  Musculoskeletal: He exhibits no edema.  Lymphadenopathy:    He has no cervical adenopathy.  Skin: Skin is warm and dry. No rash noted.  Psychiatric: His mood appears anxious.       Frustrated because of continued sxs       Assessment & Plan:

## 2012-03-17 NOTE — Patient Instructions (Signed)
Stop buspar. Start restoril at night for sleep. Temporary med. Start zoloft 50mg  daily to treat anxiety - this will take 3-4 weeks to take effect. Good to see you today, call me tomorrow with update on sleep.

## 2012-03-18 ENCOUNTER — Ambulatory Visit (HOSPITAL_BASED_OUTPATIENT_CLINIC_OR_DEPARTMENT_OTHER): Payer: BC Managed Care – PPO | Attending: Pulmonary Disease

## 2012-03-18 ENCOUNTER — Telehealth: Payer: Self-pay | Admitting: *Deleted

## 2012-03-18 VITALS — Ht 70.0 in | Wt 239.0 lb

## 2012-03-18 DIAGNOSIS — G4733 Obstructive sleep apnea (adult) (pediatric): Secondary | ICD-10-CM

## 2012-03-18 DIAGNOSIS — G479 Sleep disorder, unspecified: Secondary | ICD-10-CM | POA: Insufficient documentation

## 2012-03-18 NOTE — Assessment & Plan Note (Signed)
EKG stable at recent ER visit, cardiac enzyme neg x1, longstanding sxs, non exhertional. Anticipate anxiety related. Increase omeprazole 40mg  to bid for 5 days to see if any improvement (any GERD component)

## 2012-03-18 NOTE — Assessment & Plan Note (Signed)
Discussed cycle of anxiety worsening insomnia and insomnia worsening anxiety. Situation seems to be becoming overwhelming, affecting ability to function - has missed several days at work as well. Will treat insomnia with restoril, discussed dependent nature of medicine as well as temporary nature of treatment. To call me tomorrow with update on sleep.

## 2012-03-18 NOTE — Telephone Encounter (Signed)
Patient's wife called and said that patient had a wonderful night's sleep last night. They were both so grateful for your help. He has his sleep study tonight and they told him to call about whether or not to take the meds tonight because typically they don't want patient's to take sleep promoting meds. I advised for patient to take the meds with him and explain that if he doesn't take the med, he won't sleep therefore there would be no point in the sleep study. I also advised no naps or caffeine today. Understanding verbalized and again they were both so grateful for your help and apologized if he was irritable yesterday.

## 2012-03-18 NOTE — Assessment & Plan Note (Signed)
anticipate significant component due to anxiety, possible developing anxiety attacks with chest pressure/discomfort. Discussed we are pursuing other evaluations including sleep study, cardiac eval, and recent reassuring ER eval. Tried to reassure as much as able.  Again discussed relation of stress and anxiety to sxs. As buspar has not helped (has been on this only 1 wk) but may have worsened, recommend stop buspar. Start zoloft for anxiety, start restoril at night time for sleep. Prior GAD7 = 15/21.

## 2012-03-18 NOTE — Telephone Encounter (Signed)
Message left advising patient's wife to hold restoril tonight and resume tomorrow so that he gets a true/accurate reading on sleep study. Instructed to call with any questions.

## 2012-03-18 NOTE — Telephone Encounter (Signed)
Thanks.  I'm glad the sleep is doing better. Restoril can worsen sleep apnea so results can be worse than true picture. If had been taking regularly, would recommend keep taking, however as just started this med, I'd recommend holding med for 1 night while sleep study is conducted.

## 2012-03-30 DIAGNOSIS — G4733 Obstructive sleep apnea (adult) (pediatric): Secondary | ICD-10-CM

## 2012-03-30 NOTE — Procedures (Signed)
Brian Watson, Brian Watson           ACCOUNT NO.:  [id number]  MEDICAL RECORD NO.:  [id number]          PATIENT TYPE:  OUT  LOCATION:  SLEEP CENTER                 FACILITY:  [facility]  PHYSICIAN:  Barbaraann Share, MD,FCCPDATE OF BIRTH:  02/10/1975  DATE OF STUDY:  03/18/2012                           NOCTURNAL POLYSOMNOGRAM  REFERRING PHYSICIAN:  Barbaraann Share, MD,FCCP  REFERRING PHYSICIAN:  Barbaraann Share, MD,FCCP  INDICATION FOR STUDY:  Hypersomnia with sleep apnea.  EPWORTH SLEEPINESS SCORE:  13.  SLEEP ARCHITECTURE:  The patient had a total sleep time of 331 minutes with decreased slow wave sleep and also significantly decreased REM. Sleep onset latency was normal at 20 minutes and REM onset was normal at 96 minutes.  Sleep efficiency was mildly reduced at 85%.  RESPIRATORY DATA:  The patient was found to have no obstructive apneas and 100 obstructive hypopneas, giving him an apnea/hypopnea index of 18 events per hour.  The events occurred in all body positions, but again the patient did not achieve supine sleep.  There was loud snoring noted throughout.  OXYGEN DATA:  There was O2 desaturation as low as 88% with the patient's obstructive events.  CARDIAC DATA:  No clinically significant arrhythmias were noted.  MOVEMENT/PARASOMNIA:  The patient had no significant leg jerks or other abnormal behavior seen.  IMPRESSION/RECOMMENDATION:  Mild obstructive sleep apnea/hypopnea syndrome with an AHI of 18 events per hour and O2 desaturation as low as 88%.  Treatment for this degree of sleep apnea can include a trial of weight loss alone, upper airway surgery, dental appliance, and also CPAP.  Clinical correlation is suggested.     Barbaraann Share, MD,FCCP Diplomate, American Board of Sleep Medicine    [facility]/MEDQ  D:  03/30/2012 08:31:30  T:  03/30/2012 10:17:17  Job:  [id number]

## 2012-03-31 ENCOUNTER — Encounter: Payer: Self-pay | Admitting: Pulmonary Disease

## 2012-03-31 ENCOUNTER — Ambulatory Visit (INDEPENDENT_AMBULATORY_CARE_PROVIDER_SITE_OTHER): Payer: BC Managed Care – PPO | Admitting: Pulmonary Disease

## 2012-03-31 ENCOUNTER — Institutional Professional Consult (permissible substitution): Payer: BC Managed Care – PPO | Admitting: Pulmonary Disease

## 2012-03-31 VITALS — BP 122/88 | HR 80 | Temp 97.7°F | Ht 70.0 in | Wt 241.0 lb

## 2012-03-31 DIAGNOSIS — G4733 Obstructive sleep apnea (adult) (pediatric): Secondary | ICD-10-CM

## 2012-03-31 NOTE — Progress Notes (Signed)
  Subjective:    Patient ID: Brian Watson, male    DOB: 10-17-1975, 36 y.o.   MRN: [id number]  HPI The patient comes in today for followup of his recent sleep study.  He was found to have mild obstructive sleep apnea with an AHI of 18 events per hour.  I have reviewed the study in detail with him, and answered all of his questions.   Review of Systems  Constitutional: Negative.  Negative for fever and unexpected weight change.  HENT: Negative.  Negative for ear pain, nosebleeds, congestion, sore throat, rhinorrhea, sneezing, trouble swallowing, dental problem, postnasal drip and sinus pressure.   Eyes: Negative.  Negative for redness and itching.  Respiratory: Negative.  Negative for cough, chest tightness, shortness of breath and wheezing.   Cardiovascular: Positive for chest pain. Negative for palpitations and leg swelling.  Gastrointestinal: Negative.  Negative for nausea and vomiting.  Genitourinary: Negative.  Negative for dysuria.  Musculoskeletal: Negative.  Negative for joint swelling.  Skin: Negative.  Negative for rash.  Neurological: Negative.  Negative for headaches.  Hematological: Does not bruise/bleed easily.  Psychiatric/Behavioral: Negative.  Negative for dysphoric mood. The patient is not nervous/anxious.        Objective:   Physical Exam Obese male in no acute distress No skin breakdown or pressure necrosis from the CPAP mask Lower extremities without edema, no cyanosis Alert and oriented, moves all 4 extremities.       Assessment & Plan:

## 2012-03-31 NOTE — Assessment & Plan Note (Signed)
The patient has mild obstructive sleep apnea on his recent sleep study, but is very symptomatic at night and during the day.  I have discussed with him the options of a trial of weight loss alone, versus more aggressive treatment of his sleep apnea while he is trying to lose weight.  I have reviewed dental appliances and also CPAP with him.  At this point, the patient feels that he is symptomatic enough that he would like to treat this while he is working on weight loss.  I will start him on CPAP as a trial, and he is agreeable to this approach. I will set the patient up on cpap at a moderate pressure level to allow for desensitization, and will troubleshoot the device over the next 4-6weeks if needed.  The pt is to call me if having issues with tolerance.  Will then optimize the pressure once patient is able to wear cpap on a consistent basis.

## 2012-03-31 NOTE — Patient Instructions (Addendum)
Will start on cpap at moderate pressure level.  Please call if having any issues with tolerance. Work on weight loss followup with me in 6 weeks.

## 2012-04-04 ENCOUNTER — Encounter (HOSPITAL_BASED_OUTPATIENT_CLINIC_OR_DEPARTMENT_OTHER): Payer: BC Managed Care – PPO

## 2012-04-05 ENCOUNTER — Encounter: Payer: Self-pay | Admitting: Cardiovascular Disease

## 2012-04-05 ENCOUNTER — Ambulatory Visit (INDEPENDENT_AMBULATORY_CARE_PROVIDER_SITE_OTHER): Payer: BC Managed Care – PPO | Admitting: Cardiovascular Disease

## 2012-04-05 VITALS — BP 126/74 | HR 85 | Ht 70.0 in | Wt 245.0 lb

## 2012-04-05 DIAGNOSIS — R079 Chest pain, unspecified: Secondary | ICD-10-CM | POA: Insufficient documentation

## 2012-04-05 NOTE — Patient Instructions (Addendum)
Your physician wants you to follow-up in: 2 years.  You will receive a reminder letter in the mail two months in advance. If you don't receive a letter, please call our office to schedule the follow-up appointment.  Your physician has requested that you have an exercise tolerance test. For further information please visit [email or web address] Please also follow instruction sheet, as given.   Your physician has requested that you have an echocardiogram. Echocardiography is a painless test that uses sound waves to create images of your heart. It provides your doctor with information about the size and shape of your heart and how well your heart's chambers and valves are working. This procedure takes approximately one hour. There are no restrictions for this procedure.

## 2012-04-05 NOTE — Assessment & Plan Note (Addendum)
His pain is atypical. He has had several ED visits over the last few months. Seems to be improving with PPI and Zoloft. He does have a strong family history of CAD. Will arrange treadmill stress test to assess exercise tolerance and an echo to exclude structural heart disease and assess LV function. I will review over the phone with pt. F/U in 2 years if echo and stress test is ok.

## 2012-04-05 NOTE — Progress Notes (Signed)
History of Present Illness: 36 yo male with history of HTN, HLD, tobacco abuse, obesity, obstructive sleep apnea here today for evaluation of chest pain. He has had chest pain on/off for at least several months. He was seen in the ED at [facility] 03/15/12 for evaluation of the chest pain. His troponin was negative x1 and EKG showed sinus rhythm with no ischemic changes. He has no prior cardiac issues.   He tells me today that he began to have substernal chest pain 3 months ago. This was a substernal pressure. This was at rest. He never had exertional chest pressure. He doubled his PPI and was started on Zoloft and his pain has mostly resolved. He has been recently diagnosed with OSA and will be started on CPAP. He has started walking and has lost 20 pounds over the last two months. He has been using an stationary ellipitical exerciser. He has had no chest pain with exercise. He is still concerned about the possibility of CAD with his strong family history. He smokes 5 cigarettes per day.   Primary Care Physician: Eustaquio Boyden   Past Medical History  Diagnosis Date  . Generalized headaches     frequent  . GERD (gastroesophageal reflux disease)   . HTN (hypertension)   . H/O Bell's palsy 2011  . Smoker     cutting back  . Obesity   . Dyslipidemia   . Transaminitis     Past Surgical History  Procedure Date  . No past surgeries     Current Outpatient Prescriptions  Medication Sig Dispense Refill  . amLODipine (NORVASC) 10 MG tablet Take 1 tablet (10 mg total) by mouth daily.  90 tablet  3  . ibuprofen (ADVIL,MOTRIN) 200 MG tablet Take 200 mg by mouth as needed.      Marland Kitchen omeprazole (PRILOSEC) 40 MG capsule Take 40 mg by mouth 2 (two) times daily.      . sertraline (ZOLOFT) 50 MG tablet Take 1 tablet (50 mg total) by mouth daily.  30 tablet  3  . temazepam (RESTORIL) 15 MG capsule Take 1 capsule (15 mg total) by mouth at bedtime as needed for sleep.  30 capsule  0    No Known  Allergies  History   Social History  . Marital Status: Married    Spouse Name: N/A    Number of Children: 2  . Years of Education: N/A   Occupational History  . GENERAL MANAGER    Social History Main Topics  . Smoking status: Current Everyday Smoker -- 0.3 packs/day for 14 years    Types: Cigarettes  . Smokeless tobacco: Never Used  . Alcohol Use: Yes     Daily (2 drinks after work)  . Drug Use: No  . Sexually Active: Not on file   Other Topics Concern  . Not on file   Social History Narrative   Caffeine: 1 cup coffee/dayLives with wife and 2 children and mother, 1 dogOccupation: self employed constructionEdu: HSActivity: hunts, fishes, gardensDiet: tries to keep good diet - daily fruits/vegetables, good water intake, at work poor diet.    Family History  Problem Relation Age of Onset  . Hypertension Mother   . Hyperlipidemia Mother   . Diabetes Father   . Hyperlipidemia Father   . Hypertension Father   . Coronary artery disease Paternal Grandfather   . Stroke Neg Hx   . Cancer Neg Hx   . Coronary artery disease Father 46    smoking, severe  diabetes  . Coronary artery disease Paternal Uncle     Review of Systems:  As stated in the HPI and otherwise negative.   BP 126/74  Pulse 85  Ht 5\' 10"  (1.778 m)  Wt 245 lb (111.131 kg)  BMI 35.15 kg/m2  Physical Examination: General: Well developed, well nourished, NAD HEENT: OP clear, mucus membranes moist SKIN: warm, dry. No rashes. Neuro: No focal deficits Musculoskeletal: Muscle strength 5/5 all ext Psychiatric: Mood and affect normal Neck: No JVD, no carotid bruits, no thyromegaly, no lymphadenopathy. Lungs:Clear bilaterally, no wheezes, rhonci, crackles Cardiovascular: Regular rate and rhythm. No murmurs, gallops or rubs. Abdomen:Soft. Bowel sounds present. Non-tender.  Extremities: No lower extremity edema. Pulses are 2 + in the bilateral DP/PT.  EKG: 03/15/12:  NSR, sinus arrythmia.

## 2012-04-09 ENCOUNTER — Telehealth: Payer: Self-pay | Admitting: Pulmonary Disease

## 2012-04-09 ENCOUNTER — Ambulatory Visit (HOSPITAL_COMMUNITY): Payer: BC Managed Care – PPO | Attending: Internal Medicine | Admitting: Radiology

## 2012-04-09 DIAGNOSIS — R079 Chest pain, unspecified: Secondary | ICD-10-CM

## 2012-04-09 DIAGNOSIS — E785 Hyperlipidemia, unspecified: Secondary | ICD-10-CM | POA: Insufficient documentation

## 2012-04-09 DIAGNOSIS — I079 Rheumatic tricuspid valve disease, unspecified: Secondary | ICD-10-CM | POA: Insufficient documentation

## 2012-04-09 DIAGNOSIS — F17200 Nicotine dependence, unspecified, uncomplicated: Secondary | ICD-10-CM | POA: Insufficient documentation

## 2012-04-09 DIAGNOSIS — R072 Precordial pain: Secondary | ICD-10-CM | POA: Insufficient documentation

## 2012-04-09 DIAGNOSIS — E669 Obesity, unspecified: Secondary | ICD-10-CM | POA: Insufficient documentation

## 2012-04-09 DIAGNOSIS — I10 Essential (primary) hypertension: Secondary | ICD-10-CM | POA: Insufficient documentation

## 2012-04-09 DIAGNOSIS — I059 Rheumatic mitral valve disease, unspecified: Secondary | ICD-10-CM | POA: Insufficient documentation

## 2012-04-09 NOTE — Progress Notes (Signed)
Echocardiogram performed.  

## 2012-04-09 NOTE — Telephone Encounter (Signed)
Called and lmoam for Lecretia to check on status of referral and return my call. Rhonda J Cobb

## 2012-04-12 NOTE — Telephone Encounter (Signed)
Pt's spouse Brian Watson has called back again. Wants to speak to Brian Watson "today" before closing. Brian Watson

## 2012-04-12 NOTE — Telephone Encounter (Signed)
Called and spoke with pt's wife. Hasn't been contacted by [facility]. Spoke with [name] and she stated that she would check into why patient hasn't been contacted and will let me know.  Pt had formal study on 03/18/12 not home sleep study. Mayra Reel will check with office and let me know what the status of pt's order is. Rhonda J Cobb

## 2012-04-12 NOTE — Telephone Encounter (Signed)
Brian Watson have you spoke with lecretia yet. Please advise thanks

## 2012-04-13 ENCOUNTER — Telehealth: Payer: Self-pay | Admitting: Pulmonary Disease

## 2012-04-13 NOTE — Telephone Encounter (Signed)
I spoke with Brian Watson and she states [facility] advised her since pt has [employer] they are requiring pt to also have a titration done on cpap machine. They are just needing an order to do so ranging from 8-12. Please advise Dr. Shelle Iron, thanks

## 2012-04-13 NOTE — Telephone Encounter (Signed)
Spoke with [name] at [facility] and she stated that patient should be contacted today to arrange cpap set up. Pt's mobile number is [phone]. Rhonda J [employer] and spoke with patient and he is aware that [facility] should be calling him to schedule set up. Pt advised to call me back if he didn't hear from [facility] today. Rhonda J Cobb

## 2012-04-14 NOTE — Telephone Encounter (Signed)
That's my plan at his 6 week followup.

## 2012-04-14 NOTE — Telephone Encounter (Signed)
I spoke with Bjorn Loser and she states she has spoken to [name] about this patient and she is working on this. She states the issue is with the pt Brian Watson stating that since a titration was not done then when the pt is placed on the cpap he needs to be on auto at first to determine his optimal pressure. Bjorn Loser states if [facility] does not have this worked out be the end of the day she has another company that will accept the order. Per Bjorn Loser nothing further needed from triage. Carron Curie, CMA

## 2012-04-14 NOTE — Telephone Encounter (Signed)
lmomtcb  

## 2012-04-22 ENCOUNTER — Encounter: Payer: Self-pay | Admitting: Family Medicine

## 2012-04-22 ENCOUNTER — Ambulatory Visit (INDEPENDENT_AMBULATORY_CARE_PROVIDER_SITE_OTHER): Payer: BC Managed Care – PPO | Admitting: Family Medicine

## 2012-04-22 VITALS — BP 142/95 | HR 87 | Temp 97.7°F | Ht 70.0 in | Wt 253.2 lb

## 2012-04-22 DIAGNOSIS — J019 Acute sinusitis, unspecified: Secondary | ICD-10-CM | POA: Insufficient documentation

## 2012-04-22 MED ORDER — AMOXICILLIN-POT CLAVULANATE 875-125 MG PO TABS: 1.0000 | ORAL_TABLET | Freq: Two times a day (BID) | ORAL | Status: AC

## 2012-04-22 MED ORDER — FLUTICASONE PROPIONATE 50 MCG/ACT NA SUSP: 2.0000 | Freq: Every day | NASAL | Status: DC

## 2012-04-22 NOTE — Assessment & Plan Note (Signed)
Given duration of sxs will treat as acute sinusitis with 10 d course of augmnetin.  Anticipate component of ETD as well. May start flonase if sxs not improving. Encouraged smoking cessation.

## 2012-04-22 NOTE — Patient Instructions (Signed)
You have a sinus infection. Take medicine as prescribed: augmentin twice daily for 10 days. Push fluids and plenty of rest. Nasal saline irrigation or neti pot to help drain sinuses. May use simple mucinex (guaifenesin over the counter) with plenty of fluid to help mobilize mucous.  Don't take decongestants (such as pseudoephedrine or sudafed)with this. May fill flonase nasal steroid if congestion not getting better with above. Let us know if fever >101.5, trouble opening/closing mouth, difficulty swallowing, or worsening - you may need to be seen again.

## 2012-04-22 NOTE — Progress Notes (Signed)
  Subjective:    Patient ID: Brian Watson, male    DOB: [birth year], 36 y.o.   MRN: [id number]  HPI CC: congestion  Several week history of sinus congestion, PNdrainage, yellow mucous, then over this weekend left swollen gland noted.  Also noticing some discharge bilateral ears.  Ears muffled when opening mouth.  Mild bilateral ear pain.  Occasional ST.  Some cough from PNDrainage.  Hasn't tried any meds for this.  No fevers/chills, tooth pain, headache, coughing.  Smoking - 1/3 ppd.  No h/o asthma, COPD.  No sick contacts at home.  Got CPAP this week.  Getting used to mask.  Rarely using restoril.  Wt Readings from Last 3 Encounters:  04/22/12 253 lb 4 oz (114.873 kg)  04/05/12 245 lb (111.131 kg)  03/31/12 241 lb (109.317 kg)   Past Medical History  Diagnosis Date  . Generalized headaches     frequent  . GERD (gastroesophageal reflux disease)   . HTN (hypertension)   . H/O Bell's palsy 2011  . Smoker     cutting back  . Obesity   . Dyslipidemia   . Transaminitis      Review of Systems Per HPI    Objective:   Physical Exam  Nursing note and vitals reviewed. Constitutional: He appears well-developed and well-nourished. No distress.  HENT:  Head: Normocephalic and atraumatic.  Right Ear: Hearing, tympanic membrane, external ear and ear canal normal.  Left Ear: Hearing, tympanic membrane, external ear and ear canal normal.  Nose: Nose normal. No mucosal edema or rhinorrhea. Right sinus exhibits no maxillary sinus tenderness and no frontal sinus tenderness. Left sinus exhibits no maxillary sinus tenderness and no frontal sinus tenderness.  Mouth/Throat: Uvula is midline and mucous membranes are normal. Posterior oropharyngeal erythema present. No oropharyngeal exudate, posterior oropharyngeal edema or tonsillar abscesses.       Fluid behind TMs bilaterally  Eyes: Conjunctivae and EOM are normal. Pupils are equal, round, and reactive to light. No scleral  icterus.  Neck: Normal range of motion. Neck supple.  Cardiovascular: Normal rate, regular rhythm, normal heart sounds and intact distal pulses.   No murmur heard. Pulmonary/Chest: Effort normal and breath sounds normal. No respiratory distress. He has no wheezes. He has no rales.  Lymphadenopathy:    He has no cervical adenopathy.       Did not appreciate LAD today  Skin: Skin is warm and dry. No rash noted.       Assessment & Plan:

## 2012-05-03 ENCOUNTER — Encounter: Payer: Self-pay | Admitting: Physician Assistant

## 2012-05-03 ENCOUNTER — Ambulatory Visit (INDEPENDENT_AMBULATORY_CARE_PROVIDER_SITE_OTHER): Payer: BC Managed Care – PPO | Admitting: Physician Assistant

## 2012-05-03 DIAGNOSIS — R079 Chest pain, unspecified: Secondary | ICD-10-CM

## 2012-05-03 NOTE — Procedures (Signed)
Exercise Treadmill Test  Pre-Exercise Testing Evaluation Rhythm: normal sinus  Rate: 83   PR:  .15 QRS:  .10  QT:  .35 QTc: .41     Test  Exercise Tolerance Test Ordering MD: Clifton James MD Interpreting MD: Tereso Newcomer , PA-C  Unique Test No: 1  Treadmill:  1  Indication for ETT: chest pain - rule out ischemia  Contraindication to ETT: No   Stress Modality: exercise - treadmill  Cardiac Imaging Performed: non   Protocol: standard Bruce - maximal  Max BP:  177/58  Max MPHR (bpm):  184 156  MPHR obtained (bpm):  162 % MPHR obtained:  88%  Reached 85% MPHR (min:sec):  7:06 Total Exercise Time (min-sec):  7:28  Workload in METS:  9.2 Borg Scale: 16  Reason ETT Terminated:  patient's desire to stop    ST Segment Analysis At Rest: normal ST segments - no evidence of significant ST depression With Exercise: no evidence of significant ST depression  Other Information Arrhythmia:  No Angina during ETT:  absent (0) Quality of ETT:  diagnostic  ETT Interpretation:  normal - no evidence of ischemia by ST analysis  Comments: Fair exercise tolerance. No chest pain. Normal BP response to exercise. No ST-T changes to suggest ischemia.  Recommendations: Follow up with Dr. Verne Carrow as directed. Tereso Newcomer, PA-C  3:25 PM 05/03/2012

## 2012-05-09 ENCOUNTER — Other Ambulatory Visit: Payer: Self-pay | Admitting: Family Medicine

## 2012-05-12 ENCOUNTER — Ambulatory Visit (INDEPENDENT_AMBULATORY_CARE_PROVIDER_SITE_OTHER): Payer: BC Managed Care – PPO | Admitting: Pulmonary Disease

## 2012-05-12 ENCOUNTER — Encounter: Payer: Self-pay | Admitting: Pulmonary Disease

## 2012-05-12 VITALS — BP 140/82 | HR 95 | Temp 98.2°F | Ht 70.0 in | Wt 254.4 lb

## 2012-05-12 DIAGNOSIS — G4733 Obstructive sleep apnea (adult) (pediatric): Secondary | ICD-10-CM

## 2012-05-12 NOTE — Assessment & Plan Note (Addendum)
The patient is doing very well with CPAP, and has seen an improvement in his symptoms.  I have explained to him that we had started at a moderate pressure level, and that we need to optimize the pressure with the automatic setting built into his machine.  I have also encouraged him to work aggressively on weight loss. Care Plan:  At this point, will arrange for the patient's machine to be changed over to auto mode for 2 weeks to optimize their pressure.  I will review the downloaded data once sent by dme, and also evaluate for compliance, leaks, and residual osa.  I will call the patient and dme to discuss the results, and have the patient's machine set appropriately.  This will serve as the pt's cpap pressure titration.

## 2012-05-12 NOTE — Patient Instructions (Addendum)
Will optimize your pressure on the auto setting for the next 2-3 weeks.  Will let you know your pressure once I receive your download. Work on weight loss followup with me in 6mos, but call if having issues.

## 2012-05-12 NOTE — Progress Notes (Signed)
  Subjective:    Patient ID: Brian Watson, male    DOB: October 12, 1976, 36 y.o.   MRN: [id number]  HPI The patient comes in today for followup of his obstructive sleep apnea.  He was started on CPAP at a moderate pressure level at the last visit, and has done very well with the device.  He has not had any significant vascular pressure issues.  He has noted significant improvement in his sleep and daytime alertness.  He has been compliant with the device.   Review of Systems  Constitutional: Negative for fever and unexpected weight change.  HENT: Positive for congestion and rhinorrhea. Negative for ear pain, nosebleeds, sore throat, sneezing, trouble swallowing, dental problem, postnasal drip and sinus pressure.   Eyes: Negative for redness and itching.  Respiratory: Negative for cough, chest tightness, shortness of breath and wheezing.   Cardiovascular: Negative for palpitations and leg swelling.  Gastrointestinal: Negative for nausea and vomiting.  Genitourinary: Negative for dysuria.  Musculoskeletal: Negative for joint swelling.  Skin: Negative for rash.  Neurological: Negative for headaches.  Hematological: Does not bruise/bleed easily.  Psychiatric/Behavioral: Negative for dysphoric mood. The patient is not nervous/anxious.   All other systems reviewed and are negative.       Objective:   Physical Exam Obese male in no acute distress Nose without purulence or discharge noted No skin breakdown or pressure necrosis from the CPAP mask Lower extremities without edema, cyanosis Alert and oriented, moves all 4 extremities.  Does not appear to be sleepy.       Assessment & Plan:

## 2012-05-17 ENCOUNTER — Telehealth: Payer: Self-pay | Admitting: Pulmonary Disease

## 2012-05-17 DIAGNOSIS — G4733 Obstructive sleep apnea (adult) (pediatric): Secondary | ICD-10-CM

## 2012-05-17 NOTE — Telephone Encounter (Signed)
Let him know that we need to optimize his pressure.  The only other way to do that is at sleep center. See if the issue is when he first puts it on and is starting to fall asleep.  Right now he is set to go btw 5 -20, and we can alter this so that it starts higher.  Let me know what he thinks about this idea.

## 2012-05-17 NOTE — Telephone Encounter (Signed)
I spoke with pt and he states he would like his auto range to be from 10-20. He states it is right when he is putting the mask on he feels like he is suffocating bc he feels like he is not getting any air. Please advise KC thanks

## 2012-05-17 NOTE — Telephone Encounter (Signed)
Pt returned called- I advised per msg that order was sent. "thanks" and nothing further needed per pt. Brian Watson

## 2012-05-17 NOTE — Telephone Encounter (Signed)
[id number] x1 

## 2012-05-17 NOTE — Telephone Encounter (Signed)
I spoke with pt and he states his cpap machine was changed to auto on Friday. He states he can't wear it and feel like he is suffocating. He states he tried to wear it Saturday night and last night and was really uncomfortable and is requesting to be changed back. Please advise Dr. Shelle Iron thanks   ahc

## 2012-05-17 NOTE — Telephone Encounter (Signed)
lmomtcb x1 order has been sent to [facility]

## 2012-05-17 NOTE — Telephone Encounter (Signed)
Returning call.

## 2012-05-17 NOTE — Telephone Encounter (Signed)
Please send order to [facility] to change his auto range to 10-20, and do download after 2 weeks on that setting.  Thanks

## 2012-06-12 ENCOUNTER — Other Ambulatory Visit: Payer: Self-pay | Admitting: Pulmonary Disease

## 2012-06-12 DIAGNOSIS — G4733 Obstructive sleep apnea (adult) (pediatric): Secondary | ICD-10-CM

## 2012-07-01 ENCOUNTER — Encounter: Payer: Self-pay | Admitting: Pulmonary Disease

## 2012-07-09 ENCOUNTER — Other Ambulatory Visit: Payer: Self-pay | Admitting: Family Medicine

## 2012-07-12 ENCOUNTER — Other Ambulatory Visit: Payer: Self-pay | Admitting: Family Medicine

## 2012-07-12 ENCOUNTER — Encounter: Payer: Self-pay | Admitting: Family Medicine

## 2012-07-12 DIAGNOSIS — I10 Essential (primary) hypertension: Secondary | ICD-10-CM

## 2012-07-12 DIAGNOSIS — E785 Hyperlipidemia, unspecified: Secondary | ICD-10-CM

## 2012-07-16 ENCOUNTER — Other Ambulatory Visit: Payer: BC Managed Care – PPO

## 2012-07-23 ENCOUNTER — Ambulatory Visit: Payer: BC Managed Care – PPO | Admitting: Family Medicine

## 2012-07-30 ENCOUNTER — Other Ambulatory Visit (INDEPENDENT_AMBULATORY_CARE_PROVIDER_SITE_OTHER): Payer: BC Managed Care – PPO

## 2012-07-30 ENCOUNTER — Ambulatory Visit: Payer: BC Managed Care – PPO | Admitting: Family Medicine

## 2012-07-30 DIAGNOSIS — I10 Essential (primary) hypertension: Secondary | ICD-10-CM

## 2012-07-30 DIAGNOSIS — E785 Hyperlipidemia, unspecified: Secondary | ICD-10-CM

## 2012-07-30 LAB — COMPREHENSIVE METABOLIC PANEL
ALT: 76 U/L — ABNORMAL HIGH (ref 0–53)
AST: 61 U/L — ABNORMAL HIGH (ref 0–37)
Albumin: 3.7 g/dL (ref 3.5–5.2)
Calcium: 9.1 mg/dL (ref 8.4–10.5)
Chloride: 103 mEq/L (ref 96–112)
Potassium: 4.1 mEq/L (ref 3.5–5.1)
Total Protein: 7.3 g/dL (ref 6.0–8.3)

## 2012-07-30 LAB — LIPID PANEL: Total CHOL/HDL Ratio: 5

## 2012-08-06 ENCOUNTER — Ambulatory Visit (INDEPENDENT_AMBULATORY_CARE_PROVIDER_SITE_OTHER): Payer: BC Managed Care – PPO | Admitting: Family Medicine

## 2012-08-06 ENCOUNTER — Encounter: Payer: Self-pay | Admitting: Family Medicine

## 2012-08-06 VITALS — BP 126/78 | HR 84 | Temp 98.2°F | Wt 273.2 lb

## 2012-08-06 DIAGNOSIS — R7309 Other abnormal glucose: Secondary | ICD-10-CM

## 2012-08-06 DIAGNOSIS — F419 Anxiety disorder, unspecified: Secondary | ICD-10-CM

## 2012-08-06 DIAGNOSIS — I10 Essential (primary) hypertension: Secondary | ICD-10-CM

## 2012-08-06 DIAGNOSIS — F411 Generalized anxiety disorder: Secondary | ICD-10-CM

## 2012-08-06 DIAGNOSIS — E785 Hyperlipidemia, unspecified: Secondary | ICD-10-CM

## 2012-08-06 DIAGNOSIS — K219 Gastro-esophageal reflux disease without esophagitis: Secondary | ICD-10-CM

## 2012-08-06 DIAGNOSIS — R739 Hyperglycemia, unspecified: Secondary | ICD-10-CM

## 2012-08-06 DIAGNOSIS — E669 Obesity, unspecified: Secondary | ICD-10-CM

## 2012-08-06 DIAGNOSIS — G4733 Obstructive sleep apnea (adult) (pediatric): Secondary | ICD-10-CM

## 2012-08-06 NOTE — Assessment & Plan Note (Signed)
Chronic, stable. Great control on amlodipine. Continue med.

## 2012-08-06 NOTE — Assessment & Plan Note (Signed)
Discussed anticipated improvemnet if loses weight. Trial of beano for increased gassiness.

## 2012-08-06 NOTE — Patient Instructions (Addendum)
Work on cutting back on smoking. Let's try some beano with meals. Continue meds as up to now. Work on weight loss - we will keep an eye on liver and sugar and cholesterol levels. Return in 2-3 months for labvisit to check liver again.  If staying elevated we will check abdominal ultrasound to evaluate for fatty liver disease. Return in 6 months for physical. Flu shot today.

## 2012-08-06 NOTE — Assessment & Plan Note (Signed)
Mild off meds.  Improved.  Continue to monitor. Lab Results  Component Value Date   CHOL 202* 07/30/2012   HDL 42.00 07/30/2012   LDLDIRECT 121.2 07/30/2012   TRIG 178.0* 07/30/2012   CHOLHDL 5 07/30/2012

## 2012-08-06 NOTE — Assessment & Plan Note (Signed)
Stable on CPAP.  Noted weight gain.  Encouraged return to conscientious dietary choices to affect weight loss.

## 2012-08-06 NOTE — Assessment & Plan Note (Signed)
rtc 2 mo for further workup, if staying elevated will obtain abd [place]. Discussed implications of fatty liver and importance of chol control and weight loss for treatment.

## 2012-08-06 NOTE — Assessment & Plan Note (Signed)
Continue to encourage diet/lifestyle changes to lose weight. 19lb weight gain noted.

## 2012-08-06 NOTE — Assessment & Plan Note (Signed)
continue zoloft.  Stable from this standpoint.  Occasional breakthrough anxiety/overwhelmed but infrequent.

## 2012-08-06 NOTE — Progress Notes (Signed)
  Subjective:    Patient ID: Brian Watson, male    DOB: 1976/06/25, 36 y.o.   MRN: [id number]  HPI CC: 3 mo f/u  Since last seen by myself, stated on CPAP by pulm for mild OSA on testing but very symptomatic OSA clinically.  Doing great with this - noted significant improvement in sleep.  Averaging 7 hours/night.    Continues working significant load - 7 day work weeks.  Works 10 hours/day, 7d/wk.  Significant stress from this.  Anxiety/adjustment - still feels overwhelmed occasionally, but better overall.  HTN - No HA, vision changes, CP/tightness, SOB, leg swelling.  Tolerating amlodipine 10mg  daily.  No ankle swelling.  GERD - controlled with prilosec.  More gassy, L lateral abd irritated recently.  Several week h/o this.  Drinking 3-5 bottles of water daily.  BMs varied - loose alternating with normal consistency.  No constipation.  No blood in stool.  Noted 19lb weight gain since 04/2012.  As increased work load, more difficulty finding time to exercise and have healthy diet. Wt Readings from Last 3 Encounters:  08/06/12 273 lb 4 oz (123.945 kg)  05/12/12 254 lb 6.4 oz (115.395 kg)  04/22/12 253 lb 4 oz (114.873 kg)   Past Medical History  Diagnosis Date  . Generalized headaches     frequent  . GERD (gastroesophageal reflux disease)   . HTN (hypertension)   . H/O Bell's palsy 2011  . Smoker     cutting back  . Obesity   . Dyslipidemia   . Transaminitis     Review of Systems Per HPI    Objective:   Physical Exam  Nursing note and vitals reviewed. Constitutional: He appears well-developed and well-nourished. No distress.  HENT:  Head: Normocephalic and atraumatic.  Mouth/Throat: Oropharynx is clear and moist. No oropharyngeal exudate.  Eyes: Conjunctivae normal and EOM are normal. Pupils are equal, round, and reactive to light. No scleral icterus.  Neck: Normal range of motion. Neck supple. Carotid bruit is not present.  Cardiovascular: Normal rate, regular  rhythm, normal heart sounds and intact distal pulses.   No murmur heard. Pulmonary/Chest: Breath sounds normal. No respiratory distress. He has no wheezes. He has no rales.  Abdominal: Soft. Bowel sounds are normal. He exhibits no distension and no mass. There is no tenderness. There is no rebound and no guarding.  Musculoskeletal: He exhibits no edema.  Skin: Skin is warm and dry. No rash noted.       Assessment & Plan:

## 2012-10-11 ENCOUNTER — Encounter: Payer: Self-pay | Admitting: Pulmonary Disease

## 2012-10-14 ENCOUNTER — Other Ambulatory Visit: Payer: Self-pay | Admitting: Pulmonary Disease

## 2012-10-18 ENCOUNTER — Encounter: Payer: Self-pay | Admitting: Family Medicine

## 2012-10-18 ENCOUNTER — Ambulatory Visit (INDEPENDENT_AMBULATORY_CARE_PROVIDER_SITE_OTHER): Payer: BC Managed Care – PPO | Admitting: Family Medicine

## 2012-10-18 VITALS — BP 142/96 | HR 81 | Temp 97.9°F | Wt 284.0 lb

## 2012-10-18 DIAGNOSIS — E669 Obesity, unspecified: Secondary | ICD-10-CM

## 2012-10-18 DIAGNOSIS — R109 Unspecified abdominal pain: Secondary | ICD-10-CM | POA: Insufficient documentation

## 2012-10-18 LAB — COMPREHENSIVE METABOLIC PANEL
AST: 85 U/L — ABNORMAL HIGH (ref 0–37)
Albumin: 4.1 g/dL (ref 3.5–5.2)
Alkaline Phosphatase: 80 U/L (ref 39–117)
BUN: 13 mg/dL (ref 6–23)
Creatinine, Ser: 0.9 mg/dL (ref 0.4–1.5)
Glucose, Bld: 105 mg/dL — ABNORMAL HIGH (ref 70–99)
Potassium: 4.1 mEq/L (ref 3.5–5.1)

## 2012-10-18 LAB — IBC PANEL
Iron: 118 ug/dL (ref 42–165)
Saturation Ratios: 33.3 % (ref 20.0–50.0)
Transferrin: 253.4 mg/dL (ref 212.0–360.0)

## 2012-10-18 NOTE — Patient Instructions (Addendum)
Exclude gas producing foods (beans, onions, celery, carrots, raisins, bananas, apricots, prunes, brussel sprouts, wheat germ, pretzels) Consider trail of lactose free diet (back off milk) Trial of align for bloating/IBS symptoms (OTC). Blood work today to check on liver again. Pass by [facility]'s office to set up abdominal ultrasound.

## 2012-10-18 NOTE — Assessment & Plan Note (Signed)
eval with iron panel, hepatitis panel, and rpt LFTs. Ordered abd [place] today to eval fatty liver. Continue to encourage weight loss.

## 2012-10-18 NOTE — Assessment & Plan Note (Signed)
continue to encourage diet/lifestyle change to attain sustainable weight loss.

## 2012-10-18 NOTE — Assessment & Plan Note (Signed)
Persistent, unclear etiology.  abd exam benign, no red flags. Check lipase, abd [place] to further eval. Noted weight gain, ?sxs from increased abd pressure.  Encouraged weight loss. In interim, try to avoid gas producing foods, lactose, and trial of align.

## 2012-10-18 NOTE — Progress Notes (Signed)
  Subjective:    Patient ID: Brian Watson, male    DOB: 1975-11-12, 37 y.o.   MRN: [id number]  HPI CC: abd pain  Has been avoiding NSAIDs, taking tylenol instead.  Recently had stomach bug, resolved.  Continued L abd irritation and gassiness - ongoing for last 3 months.  Intermitttent.  Not constant.  Not really food related.  Tried beano, maalox, gas X, pepto bismol, alka seltzer.  Energy level down.  Regular BMs, no Watson in stool.  No diarrhea, constipation.side pain does not improve with BM.  No hematuria.  No h/o kidney stones.  Sits in vehicle all day.  Never nauseated or vomiting.  No dysphagia or early satiety.  Has had some milk issues in past - whole milk upsets stomach.  Denies falls or injury to that area.  GERD - controlled with prilosec.   OSA - mild.  Controlled with CPAP.  Since new year, started eating better - more salads.  Packing lunch daily.. Wt Readings from Last 3 Encounters:  10/18/12 284 lb (128.822 kg)  08/06/12 273 lb 4 oz (123.945 kg)  05/12/12 254 lb 6.4 oz (115.395 kg)   Past Medical History  Diagnosis Date  . Generalized headaches     frequent  . GERD (gastroesophageal reflux disease)   . HTN (hypertension)   . H/O Bell's palsy 2011  . Smoker     cutting back  . Obesity   . Dyslipidemia   . Transaminitis     Review of Systems     Objective:   Physical Exam  Nursing note and vitals reviewed. Constitutional: He appears well-developed and well-nourished. No distress.  HENT:  Mouth/Throat: Oropharynx is clear and moist. No oropharyngeal exudate.  Eyes: Conjunctivae normal and EOM are normal. Pupils are equal, round, and reactive to light.  Cardiovascular: Normal rate, regular rhythm, normal heart sounds and intact distal pulses.   No murmur heard. Pulmonary/Chest: Effort normal and breath sounds normal. No respiratory distress. He has no wheezes. He has no rales.  Abdominal: Soft. Bowel sounds are normal. He exhibits no  distension and no mass. There is no hepatosplenomegaly. There is no tenderness. There is no rebound, no guarding, no CVA tenderness and negative Murphy's sign.       obese  Musculoskeletal: He exhibits no edema.  Skin: Skin is warm and dry.  Psychiatric: He has a normal mood and affect.       Assessment & Plan:

## 2012-10-19 LAB — HEPATITIS PANEL, ACUTE
Hep A IgM: NEGATIVE
Hep B C IgM: NEGATIVE

## 2012-10-26 ENCOUNTER — Other Ambulatory Visit: Payer: BC Managed Care – PPO

## 2012-11-15 ENCOUNTER — Ambulatory Visit: Payer: BC Managed Care – PPO | Admitting: Pulmonary Disease

## 2012-11-17 ENCOUNTER — Ambulatory Visit (INDEPENDENT_AMBULATORY_CARE_PROVIDER_SITE_OTHER): Payer: BC Managed Care – PPO | Admitting: Pulmonary Disease

## 2012-11-17 ENCOUNTER — Encounter: Payer: Self-pay | Admitting: Pulmonary Disease

## 2012-11-17 VITALS — BP 140/80 | HR 84 | Temp 98.1°F | Ht 70.0 in | Wt 286.0 lb

## 2012-11-17 DIAGNOSIS — G4733 Obstructive sleep apnea (adult) (pediatric): Secondary | ICD-10-CM

## 2012-11-17 NOTE — Patient Instructions (Addendum)
Will get the issue resolved with your cpap hose. Keep working on weight loss, and try to stop smoking. If doing well with cpap, followup with me in one year.  Let me know if you continue to have issues with the cpap.

## 2012-11-17 NOTE — Progress Notes (Signed)
  Subjective:    Patient ID: Brian Watson, male    DOB: 05-Apr-1976, 37 y.o.   MRN: [id number]  HPI The patient comes in today for followup of his known obstructive sleep apnea.  He has been wearing CPAP compliantly, and ultimately found a mask that fits well.  He sleeps well with the device, and notes no daytime sleepiness.  He is having an issue with his CPAP hose, and will need to be replaced.   Review of Systems  Constitutional: Negative for fever and unexpected weight change.  HENT: Positive for ear pain ( x 1 week ago), congestion, rhinorrhea, sneezing and postnasal drip. Negative for nosebleeds, sore throat, trouble swallowing, dental problem and sinus pressure.   Eyes: Negative for redness and itching.  Respiratory: Negative for cough, chest tightness, shortness of breath and wheezing.   Cardiovascular: Negative for palpitations and leg swelling.  Gastrointestinal: Negative for nausea and vomiting.  Genitourinary: Negative for dysuria.  Musculoskeletal: Negative for joint swelling.  Skin: Negative for rash.  Neurological: Negative for headaches.  Hematological: Does not bruise/bleed easily.  Psychiatric/Behavioral: Negative for dysphoric mood. The patient is not nervous/anxious.        Objective:   Physical Exam Obese male in no acute distress Nose without purulence or discharge noted No skin breakdown or pressure necrosis from the CPAP mask Neck without lymphadenopathy or thyromegaly Lower extremities without edema, no cyanosis Alert and oriented, moves all 4 extremities.       Assessment & Plan:

## 2012-11-17 NOTE — Assessment & Plan Note (Signed)
The patient is wearing CPAP compliantly, and feels that he is doing well with the device.  His only complaint currently is that of an issue with his hose, and this will need to be replaced.  He is describing some airtrapping symptoms in the am's, and I have asked him to stop smoking to see if this will help.  Finally, I have encouraged him to work aggressively on weight loss.

## 2012-12-07 ENCOUNTER — Other Ambulatory Visit: Payer: Self-pay | Admitting: Family Medicine

## 2013-01-17 ENCOUNTER — Other Ambulatory Visit: Payer: Self-pay | Admitting: Family Medicine

## 2013-01-26 ENCOUNTER — Telehealth: Payer: Self-pay

## 2013-01-26 NOTE — Telephone Encounter (Signed)
Brian Watson request refill on amlodipine; advised sent electronically to [facility] on 01/17/13. Pt already has appt 02/25/13. Brian Watson will contact pharmacy.

## 2013-02-25 ENCOUNTER — Encounter: Payer: Self-pay | Admitting: Family Medicine

## 2013-02-25 ENCOUNTER — Telehealth: Payer: Self-pay | Admitting: Radiology

## 2013-02-25 ENCOUNTER — Ambulatory Visit (INDEPENDENT_AMBULATORY_CARE_PROVIDER_SITE_OTHER): Payer: BC Managed Care – PPO | Admitting: Family Medicine

## 2013-02-25 VITALS — BP 138/64 | HR 84 | Temp 98.0°F | Ht 69.25 in | Wt 260.0 lb

## 2013-02-25 DIAGNOSIS — R739 Hyperglycemia, unspecified: Secondary | ICD-10-CM

## 2013-02-25 DIAGNOSIS — R252 Cramp and spasm: Secondary | ICD-10-CM

## 2013-02-25 DIAGNOSIS — F17200 Nicotine dependence, unspecified, uncomplicated: Secondary | ICD-10-CM

## 2013-02-25 DIAGNOSIS — R7309 Other abnormal glucose: Secondary | ICD-10-CM

## 2013-02-25 DIAGNOSIS — IMO0002 Reserved for concepts with insufficient information to code with codable children: Secondary | ICD-10-CM | POA: Insufficient documentation

## 2013-02-25 DIAGNOSIS — E1140 Type 2 diabetes mellitus with diabetic neuropathy, unspecified: Secondary | ICD-10-CM | POA: Insufficient documentation

## 2013-02-25 DIAGNOSIS — E785 Hyperlipidemia, unspecified: Secondary | ICD-10-CM

## 2013-02-25 DIAGNOSIS — E669 Obesity, unspecified: Secondary | ICD-10-CM

## 2013-02-25 DIAGNOSIS — K219 Gastro-esophageal reflux disease without esophagitis: Secondary | ICD-10-CM

## 2013-02-25 DIAGNOSIS — I10 Essential (primary) hypertension: Secondary | ICD-10-CM

## 2013-02-25 DIAGNOSIS — E1165 Type 2 diabetes mellitus with hyperglycemia: Secondary | ICD-10-CM

## 2013-02-25 DIAGNOSIS — Z0000 Encounter for general adult medical examination without abnormal findings: Secondary | ICD-10-CM

## 2013-02-25 DIAGNOSIS — Z23 Encounter for immunization: Secondary | ICD-10-CM

## 2013-02-25 HISTORY — DX: Type 2 diabetes mellitus with hyperglycemia: E11.65

## 2013-02-25 HISTORY — DX: Reserved for concepts with insufficient information to code with codable children: IMO0002

## 2013-02-25 LAB — COMPREHENSIVE METABOLIC PANEL
ALT: 89 U/L — ABNORMAL HIGH (ref 0–53)
AST: 116 U/L — ABNORMAL HIGH (ref 0–37)
CO2: 27 mEq/L (ref 19–32)
Calcium: 9.5 mg/dL (ref 8.4–10.5)
Chloride: 92 mEq/L — ABNORMAL LOW (ref 96–112)
GFR: 99.34 mL/min (ref >60.00)
Sodium: 128 mEq/L — ABNORMAL LOW (ref 135–145)
Total Protein: 7.3 g/dL (ref 6.0–8.3)

## 2013-02-25 LAB — HEMOGLOBIN A1C: Hgb A1c MFr Bld: 11.8 % — ABNORMAL HIGH (ref 4.6–6.5)

## 2013-02-25 MED ORDER — GLUCOSE BLOOD VI STRP: ORAL_STRIP | Status: AC

## 2013-02-25 MED ORDER — SERTRALINE HCL 50 MG PO TABS: ORAL_TABLET | ORAL | Status: DC

## 2013-02-25 MED ORDER — AMLODIPINE BESYLATE 10 MG PO TABS: ORAL_TABLET | ORAL | Status: DC

## 2013-02-25 MED ORDER — BLOOD GLUCOSE METER KIT: PACK | Status: DC

## 2013-02-25 MED ORDER — METFORMIN HCL 500 MG PO TABS: 500.0000 mg | ORAL_TABLET | Freq: Two times a day (BID) | ORAL | Status: DC

## 2013-02-25 MED ORDER — OMEPRAZOLE 40 MG PO CPDR: DELAYED_RELEASE_CAPSULE | ORAL | Status: DC

## 2013-02-25 NOTE — Telephone Encounter (Signed)
[facility] called a critical Glucose - [phone]

## 2013-02-25 NOTE — Addendum Note (Signed)
Addended by: Eustaquio Boyden on: 02/25/2013 01:45 PM   Modules accepted: Orders

## 2013-02-25 NOTE — Assessment & Plan Note (Signed)
Never completed abd [place] Will check LFTs and if persistently elevated will schedule abd [place] to eval fatty liver.

## 2013-02-25 NOTE — Assessment & Plan Note (Signed)
Preventative protocols reviewed and updated unless pt declined. Discussed healthy diet and lifestyle.  Tdap today. cbg check today.

## 2013-02-25 NOTE — Addendum Note (Signed)
Addended by: Patience Musca on: 02/25/2013 01:08 PM   Modules accepted: Orders

## 2013-02-25 NOTE — Patient Instructions (Addendum)
Return next week fasting for blood work to check on leg cramps, sugar, and cholesterol. Good job with weight loss - keep it up. Good to see you today, call us with questions. If liver function staying elevated, I will recommend scheduling abdominal ultrasound.  Sugar was very high - looks like you've developed diabetes Start metformin at 500mg  nightly for 1 week then increase to twice daily with food. I've sent in glucose meter. Return to see me next week for follow up diabetes. Drink lots of water.  Diabetes Meal Planning Guide The diabetes meal planning guide is a tool to help you plan your meals and snacks. It is important for people with diabetes to manage their blood glucose (sugar) levels. Choosing the right foods and the right amounts throughout your day will help control your blood glucose. Eating right can even help you improve your blood pressure and reach or maintain a healthy weight. CARBOHYDRATE COUNTING MADE EASY When you eat carbohydrates, they turn to sugar. This raises your blood glucose level. Counting carbohydrates can help you control this level so you feel better. When you plan your meals by counting carbohydrates, you can have more flexibility in what you eat and balance your medicine with your food intake. Carbohydrate counting simply means adding up the total amount of carbohydrate grams in your meals and snacks. Try to eat about the same amount at each meal. Foods with carbohydrates are listed below. Each portion below is 1 carbohydrate serving or 15 grams of carbohydrates. Ask your dietician how many grams of carbohydrates you should eat at each meal or snack. Grains and Starches  1 slice bread.   English muffin or hotdog/hamburger bun.   cup cold cereal (unsweetened).   cup cooked pasta or rice.   cup starchy vegetables (corn, potatoes, peas, beans, winter squash).  1 tortilla (6 inches).   bagel.  1 waffle or pancake (size of a CD).   cup cooked  cereal.  4 to 6 small crackers. *Whole grain is recommended. Fruit  1 cup fresh unsweetened berries, melon, papaya, pineapple.  1 small fresh fruit.   banana or mango.   cup fruit juice (4 oz unsweetened).   cup canned fruit in natural juice or water.  2 tbs dried fruit.  12 to 15 grapes or cherries. Milk and Yogurt  1 cup fat-free or 1% milk.  1 cup soy milk.  6 oz light yogurt with sugar-free sweetener.  6 oz low-fat soy yogurt.  6 oz plain yogurt. Vegetables  1 cup raw or  cup cooked is counted as 0 carbohydrates or a "free" food.  If you eat 3 or more servings at 1 meal, count them as 1 carbohydrate serving. Other Carbohydrates   oz chips or pretzels.   cup ice cream or frozen yogurt.   cup sherbet or sorbet.  2 inch square cake, no frosting.  1 tbs honey, sugar, jam, jelly, or syrup.  2 small cookies.  3 squares of graham crackers.  3 cups popcorn.  6 crackers.  1 cup broth-based soup.  Count 1 cup casserole or other mixed foods as 2 carbohydrate servings.  Foods with less than 20 calories in a serving may be counted as 0 carbohydrates or a "free" food. You may want to purchase a book or computer software that lists the carbohydrate gram counts of different foods. In addition, the nutrition facts panel on the labels of the foods you eat are a good source of this information. The label will  tell you how big the serving size is and the total number of carbohydrate grams you will be eating per serving. Divide this number by 15 to obtain the number of carbohydrate servings in a portion. Remember, 1 carbohydrate serving equals 15 grams of carbohydrate. SERVING SIZES Measuring foods and serving sizes helps you make sure you are getting the right amount of food. The list below tells how big or small some common serving sizes are.  1 oz.........4 stacked dice.  3 oz........Marland KitchenDeck of cards.  1 tsp.......Marland KitchenTip of little finger.  1  tbs......Marland KitchenMarland KitchenThumb.  2 tbs.......Marland KitchenGolf ball.   cup......Marland KitchenHalf of a fist.  1 cup.......Marland KitchenA fist. SAMPLE DIABETES MEAL PLAN Below is a sample meal plan that includes foods from the grain and starches, dairy, vegetable, fruit, and meat groups. A dietician can individualize a meal plan to fit your calorie needs and tell you the number of servings needed from each food group. However, controlling the total amount of carbohydrates in your meal or snack is more important than making sure you include all of the food groups at every meal. You may interchange carbohydrate containing foods (dairy, starches, and fruits). The meal plan below is an example of a 2000 calorie diet using carbohydrate counting. This meal plan has 17 carbohydrate servings. Breakfast  1 cup oatmeal (2 carb servings).   cup light yogurt (1 carb serving).  1 cup blueberries (1 carb serving).   cup almonds. Snack  1 large apple (2 carb servings).  1 low-fat string cheese stick. Lunch  Chicken breast salad.  1 cup spinach.   cup chopped tomatoes.  2 oz chicken breast, sliced.  2 tbs low-fat [place] dressing.  12 whole-wheat crackers (2 carb servings).  12 to 15 grapes (1 carb serving).  1 cup low-fat milk (1 carb serving). Snack  1 cup carrots.   cup hummus (1 carb serving). Dinner  3 oz broiled salmon.  1 cup brown rice (3 carb servings). Snack  1  cups steamed broccoli (1 carb serving) drizzled with 1 tsp olive oil and lemon juice.  1 cup light pudding (2 carb servings).  Document Released: 06/26/2005 Document Revised: 12/22/2011 Document Reviewed: 05/07/2009 [facility] Patient Information 2013 [place], [place].

## 2013-02-25 NOTE — Assessment & Plan Note (Signed)
Controlled on omeprazole, continue.

## 2013-02-25 NOTE — Assessment & Plan Note (Signed)
New diagnosis with cbg today 541.  Had sausage biscuit for breakfast this morning. Discussed avoiding carbs and importance of increased water intake over weekend. Start metformin 500mg  qhs for 1 wk then increase to bid. Red flags to seek ER care over weekend discussed. Return next week for f/u new onset diabetes. Sent in glucose meter as well as provided with diabetic diet handout.

## 2013-02-25 NOTE — Assessment & Plan Note (Signed)
check CK, K, Mg

## 2013-02-25 NOTE — Assessment & Plan Note (Signed)
Recheck FLP when returns fasting. 

## 2013-02-25 NOTE — Progress Notes (Signed)
Subjective:    Patient ID: Brian Watson, male    DOB: 10-Feb-1976, 37 y.o.   MRN: [id number]  HPI CC: CPE  HTN - well controlled on norvasc. GERD - well controlled on omeprazole. Anxiety - on zoloft daily. Smoker - 1/2 ppd. OSA - on CPAP.  Increased thirst, especially at night. Wakes up with dry mouth.  Vision changing.  Some leg cramps at night.  Drinking plenty of water.  Increased urination.  No dysuria.  No results found for this basename: HGBA1C   Wt Readings from Last 3 Encounters:  02/25/13 260 lb (117.935 kg)  11/17/12 286 lb (129.729 kg)  10/18/12 284 lb (128.822 kg)  down 26 lbs since last visit!  Congratulated.  Has been trying to lose weight.  Caffeine: 1 cup coffee/day Lives with wife and 2 children and mother, 1 dog Occupation: self employed [employer] Edu: HS Activity: hunts, fishes, gardens Diet: tries to keep good diet - daily fruits/vegetables, good water intake, at work poor diet.  Preventative: unsure last CPE Flu 2013 Tetanus unsure.  To receive Tdap today.  Medications and allergies reviewed and updated in chart.  Past histories reviewed and updated if relevant as below. Patient Active Problem List   Diagnosis Date Noted  . Left sided abdominal pain 10/18/2012  . Chest pain 04/05/2012  . Anxiety 03/09/2012  . OSA (obstructive sleep apnea) 01/19/2012  . Dyslipidemia 01/19/2012  . Transaminitis 01/19/2012  . Generalized headaches   . Smoker   . HTN (hypertension)   . GERD (gastroesophageal reflux disease)   . Obesity    Past Medical History  Diagnosis Date  . Generalized headaches     frequent  . GERD (gastroesophageal reflux disease)   . HTN (hypertension)   . H/O Bell's palsy 2011  . Smoker     cutting back  . Obesity   . Dyslipidemia   . Transaminitis    History reviewed. No pertinent past surgical history. History  Substance Use Topics  . Smoking status: Current Every Day Smoker -- 0.50 packs/day for 14 years   Types: Cigarettes  . Smokeless tobacco: Never Used  . Alcohol Use: Yes     Comment: Daily (2 drinks after work)   Family History  Problem Relation Age of Onset  . Hypertension Mother   . Hyperlipidemia Mother   . Diabetes Father   . Hyperlipidemia Father   . Hypertension Father   . Coronary artery disease Paternal Grandfather   . Stroke Neg Hx   . Cancer Neg Hx   . Coronary artery disease Father 46    smoking, severe diabetes  . Coronary artery disease Paternal Uncle    No Known Allergies Current Outpatient Prescriptions on File Prior to Visit  Medication Sig Dispense Refill  . ibuprofen (ADVIL,MOTRIN) 200 MG tablet Take 200 mg by mouth as needed.       No current facility-administered medications on file prior to visit.    Review of Systems  Constitutional: Negative for fever, chills, activity change, appetite change, fatigue and unexpected weight change.  HENT: Negative for hearing loss and neck pain.   Eyes: Negative for visual disturbance.  Respiratory: Negative for cough, chest tightness, shortness of breath and wheezing.   Cardiovascular: Negative for chest pain, palpitations and leg swelling.  Gastrointestinal: Negative for nausea, vomiting, abdominal pain, diarrhea, constipation, Watson in stool and abdominal distention.  Genitourinary: Negative for hematuria and difficulty urinating.  Musculoskeletal: Negative for myalgias and arthralgias.  Skin: Negative for rash.  Neurological: Negative for dizziness, seizures, syncope and headaches.  Hematological: Negative for adenopathy. Does not bruise/bleed easily.  Psychiatric/Behavioral: Negative for dysphoric mood. The patient is not nervous/anxious.        Objective:   Physical Exam  Nursing note and vitals reviewed. Constitutional: He is oriented to person, place, and time. He appears well-developed and well-nourished. No distress.  HENT:  Head: Normocephalic and atraumatic.  Nose: Nose normal.  Mouth/Throat:  Oropharynx is clear and moist. No oropharyngeal exudate.  Eyes: Conjunctivae and EOM are normal. Pupils are equal, round, and reactive to light. No scleral icterus.  Neck: Normal range of motion. Neck supple. No thyromegaly present.  Cardiovascular: Normal rate, regular rhythm, normal heart sounds and intact distal pulses.   No murmur heard. Pulses:      Radial pulses are 2+ on the right side, and 2+ on the left side.  Pulmonary/Chest: Effort normal and breath sounds normal. No respiratory distress. He has no wheezes. He has no rales.  Abdominal: Soft. Bowel sounds are normal. He exhibits no distension and no mass. There is no tenderness. There is no rebound and no guarding.  Musculoskeletal: Normal range of motion. He exhibits no edema.  Lymphadenopathy:    He has no cervical adenopathy.  Neurological: He is alert and oriented to person, place, and time.  CN grossly intact, station and gait intact  Skin: Skin is warm and dry. No rash noted.  Psychiatric: He has a normal mood and affect. His behavior is normal. Judgment and thought content normal.       Assessment & Plan:

## 2013-02-25 NOTE — Assessment & Plan Note (Signed)
Encouraged cessation.

## 2013-02-25 NOTE — Assessment & Plan Note (Signed)
Encouraged continued weight loss. Body mass index is 38.12 kg/(m^2).

## 2013-02-25 NOTE — Assessment & Plan Note (Signed)
Chronic, stable. Continue med. 

## 2013-02-25 NOTE — Telephone Encounter (Signed)
Noted. See office note.  Pt following up next week.

## 2013-02-28 ENCOUNTER — Other Ambulatory Visit (INDEPENDENT_AMBULATORY_CARE_PROVIDER_SITE_OTHER): Payer: BC Managed Care – PPO

## 2013-02-28 DIAGNOSIS — E785 Hyperlipidemia, unspecified: Secondary | ICD-10-CM

## 2013-02-28 LAB — LIPID PANEL
HDL: 22 mg/dL — ABNORMAL LOW (ref >39.00)
Total CHOL/HDL Ratio: 12

## 2013-02-28 LAB — LDL CHOLESTEROL, DIRECT: Direct LDL: 50.9 mg/dL

## 2013-03-04 ENCOUNTER — Ambulatory Visit (INDEPENDENT_AMBULATORY_CARE_PROVIDER_SITE_OTHER): Payer: BC Managed Care – PPO | Admitting: Family Medicine

## 2013-03-04 ENCOUNTER — Encounter: Payer: Self-pay | Admitting: Family Medicine

## 2013-03-04 VITALS — BP 124/82 | HR 88 | Temp 98.1°F | Wt 261.8 lb

## 2013-03-04 DIAGNOSIS — IMO0001 Reserved for inherently not codable concepts without codable children: Secondary | ICD-10-CM

## 2013-03-04 DIAGNOSIS — E1165 Type 2 diabetes mellitus with hyperglycemia: Secondary | ICD-10-CM

## 2013-03-04 LAB — GLUCOSE, POCT (MANUAL RESULT ENTRY): POC Glucose: 245 mg/dl — AB (ref 70–99)

## 2013-03-04 LAB — MICROALBUMIN / CREATININE URINE RATIO: Microalb, Ur: 1.5 mg/dL (ref 0.0–1.9)

## 2013-03-04 NOTE — Assessment & Plan Note (Signed)
New diagnosis.   Discussed pathophysiology behind diabetes, as well as glucose goals fasting and post prandial. Questions answered. Will slowly titrate metformin up Had eye exam in January.  Foot exam today. Congratulated on healthy diet changes, encouraged continued water intake. I have asked him to check sugars BID (fasting and post prandial) and bring me 1 wk log of sugars next visit to review with him. rtc 1 mo for f/u diabetes. Check microalb today.

## 2013-03-04 NOTE — Patient Instructions (Addendum)
Check sugars twice a day - fasting in the morning (goal 80-120), and then once during the day 2 hours after a meal (goal 120-180).  Or check sugars whenever you feel ill. Return to see me in 1 month.  Bring me log of sugars 1 week prior to next appointment. On Monday - increase metformin to 1 pill in morning and 2 at night if sugars not at goal with current dosing. May go up to 1000mg  metformin in am and 1000mg  at night. Urine checked today. Good to see you today, call us with questions.

## 2013-03-04 NOTE — Progress Notes (Signed)
  Subjective:    Patient ID: Brian Watson, male    DOB: 1976-03-14, 37 y.o.   MRN: [id number]  HPI CC: new onset diabetes  Found to be have new onset diabetes at physical last week - with cbg >500.  Since then, checking sugars TID. Changed diet - no sugars, no salt.  More oatmeal, grains, fruits/vegetables, yogurt.  Lowest CBG 221, averaging 220-280.  Not checking fasting. Familiar with diabetes because father was insulin dependent diabetic.  Bad experience with insulin from this.  Denies paresthesias.  Denies low sugars. Had eye exam 10/2012. Foot exam today.  Compliant with metformin 500mg  bid.  Tolerating well. Lab Results  Component Value Date   CREATININE 0.9 02/25/2013    Wt Readings from Last 3 Encounters:  03/04/13 261 lb 12 oz (118.729 kg)  02/25/13 260 lb (117.935 kg)  11/17/12 286 lb (129.729 kg)  Body mass index is 38.37 kg/(m^2).    Past Medical History  Diagnosis Date  . Generalized headaches     frequent  . GERD (gastroesophageal reflux disease)   . HTN (hypertension)   . H/O Bell's palsy 2011  . Smoker     cutting back  . Obesity   . Dyslipidemia   . Transaminitis   . Diabetes type 2, uncontrolled 02/25/2013    Review of Systems Per HPI    Objective:   Physical Exam  Nursing note and vitals reviewed. Constitutional: He appears well-developed and well-nourished. No distress.  obese  HENT:  Head: Normocephalic and atraumatic.  Right Ear: External ear normal.  Left Ear: External ear normal.  Nose: Nose normal.  Mouth/Throat: Oropharynx is clear and moist. No oropharyngeal exudate.  Eyes: Conjunctivae and EOM are normal. Pupils are equal, round, and reactive to light. No scleral icterus.  Neck: Normal range of motion. Neck supple.  Cardiovascular: Normal rate, regular rhythm, normal heart sounds and intact distal pulses.   No murmur heard. Pulmonary/Chest: Effort normal and breath sounds normal. No respiratory distress. He has no  wheezes. He has no rales.  Musculoskeletal: He exhibits no edema.  Diabetic foot exam: Normal inspection No skin breakdown Calluses present bilaterally Normal DP/PT pulses Normal sensation to light touch and monofilament Nails normal  Lymphadenopathy:    He has no cervical adenopathy.  Skin: Skin is warm and dry. No rash noted.  Psychiatric: He has a normal mood and affect.       Assessment & Plan:

## 2013-03-08 ENCOUNTER — Encounter: Payer: Self-pay | Admitting: *Deleted

## 2013-04-04 ENCOUNTER — Ambulatory Visit (INDEPENDENT_AMBULATORY_CARE_PROVIDER_SITE_OTHER): Payer: BC Managed Care – PPO | Admitting: Family Medicine

## 2013-04-04 ENCOUNTER — Ambulatory Visit: Payer: BC Managed Care – PPO | Admitting: Family Medicine

## 2013-04-04 ENCOUNTER — Encounter: Payer: Self-pay | Admitting: Family Medicine

## 2013-04-04 VITALS — BP 130/76 | HR 84 | Temp 98.2°F | Wt 266.5 lb

## 2013-04-04 DIAGNOSIS — E785 Hyperlipidemia, unspecified: Secondary | ICD-10-CM

## 2013-04-04 DIAGNOSIS — E119 Type 2 diabetes mellitus without complications: Secondary | ICD-10-CM

## 2013-04-04 MED ORDER — METFORMIN HCL 500 MG PO TABS: 500.0000 mg | ORAL_TABLET | Freq: Two times a day (BID) | ORAL | Status: DC

## 2013-04-04 NOTE — Assessment & Plan Note (Addendum)
Anticipate improved control based on sugars he recalls. No changes today. Reviewed healthy diet/lifestlye changes. rtc 3 mo for fasting blood work and afterwards for diabetes recheck. Pt agrees with plan. Discussed pneumovax but I forgot to provide.

## 2013-04-04 NOTE — Patient Instructions (Addendum)
Good job with healthy diet changes This and metformin have significantly brough down sugar levels. Return to see me in 3 months, prior fasting for blood work

## 2013-04-04 NOTE — Progress Notes (Signed)
Pt seen and evaluated with PA student Anna Genre  CC: 1 mo f/u new onset DM  DM - overall improvement on metformin.  Fasting sugars 140.  PM sugars 120.  High cbg 180.  No recent lows.  No dizziness, lightheadedness.  Cut out all sodas.  Banana for breakfast.  Increased fruits/nuts and grains.  Trying to cut out red meat.  Mostly chicken and fish.  No HA, paresthesias, dyspnea or chest tightness.  Taking metformin 500mg  in am and 1000mg  in pm. Lab Results  Component Value Date   HGBA1C 11.8* 02/25/2013   Smoking 1/2 ppd - down from 1 ppd. Exercise - gardening, fishing  Stays busy at work, 60 hr weeks.  Wt Readings from Last 3 Encounters:  04/04/13 266 lb 8 oz (120.884 kg)  03/04/13 261 lb 12 oz (118.729 kg)  02/25/13 260 lb (117.935 kg)    PE: GEN: WDWN CM, NAD CVS: nl S1, S2, no m/r/g Pulm: CTAB, no c/w Ext: no pedal edema Foot exam: done last visit.

## 2013-04-04 NOTE — Progress Notes (Signed)
  Subjective:    Patient ID: Brian Watson, male    DOB: 1975-11-20, 37 y.o.   MRN: [id number]  CC: diabetes 1 mo f/u  HPI Diabetes -dx on 5/16 -currently taking metformin 500mg  am, 1000 mg pm -fasting sugars around 140, night checks around 120 -highest cbg 180 -reduced red meat consumption, mostly eating chicken and fish -stopped drinking sodas, drinking mostly water, alcohol occasionally on weekends -increased intake fruits, vegetables, nuts grains -very active at work up to 60 hrs a week, gardening, fishing -smoking .5 ppd, down from 1ppd  Wt Readings from Last 3 Encounters:  04/04/13 266 lb 8 oz (120.884 kg)  03/04/13 261 lb 12 oz (118.729 kg)  02/25/13 260 lb (117.935 kg)   Lab Results  Component Value Date   HGBA1C 11.8* 02/25/2013     Review of Systems  Constitutional: Negative for fever, chills, diaphoresis, fatigue and unexpected weight change.  Respiratory: Negative for apnea, choking, chest tightness and shortness of breath.   Endocrine: Negative for polydipsia, polyphagia and polyuria.  Genitourinary: Negative for urgency and frequency.  Skin: Negative for rash.  Neurological: Negative for dizziness, weakness, light-headedness, numbness and headaches.       Objective:   Physical Exam  Constitutional: He is oriented to person, place, and time. He appears well-developed and well-nourished. No distress.  HENT:  Head: Normocephalic.  Mouth/Throat: Oropharynx is clear and moist and mucous membranes are normal. No oral lesions. No oropharyngeal exudate.  Eyes: Conjunctivae are normal. Pupils are equal, round, and reactive to light.  Cardiovascular: Normal rate and regular rhythm.  Exam reveals no gallop and no friction rub.   No murmur heard. Pulmonary/Chest: Effort normal and breath sounds normal. No respiratory distress.  Neurological: He is alert and oriented to person, place, and time.  Skin: Skin is warm. No rash noted.          Assessment  & Plan:  Diabetes -return for f/u in 3 months, recheck a1c and lipid panel fasting prior to visit -eye exam due in January, foot exam next visit -congratulated on lifestyle modifications -advised to continue checking Watson sugars at home regularly

## 2013-04-16 ENCOUNTER — Other Ambulatory Visit: Payer: Self-pay | Admitting: Family Medicine

## 2013-04-21 ENCOUNTER — Other Ambulatory Visit: Payer: Self-pay

## 2013-06-28 ENCOUNTER — Other Ambulatory Visit (INDEPENDENT_AMBULATORY_CARE_PROVIDER_SITE_OTHER): Payer: BC Managed Care – PPO

## 2013-06-28 DIAGNOSIS — E119 Type 2 diabetes mellitus without complications: Secondary | ICD-10-CM

## 2013-06-28 DIAGNOSIS — E785 Hyperlipidemia, unspecified: Secondary | ICD-10-CM

## 2013-06-28 LAB — LIPID PANEL
HDL: 34.3 mg/dL — ABNORMAL LOW (ref >39.00)
Total CHOL/HDL Ratio: 6
Triglycerides: 284 mg/dL — ABNORMAL HIGH (ref 0.0–149.0)
VLDL: 56.8 mg/dL — ABNORMAL HIGH (ref 0.0–40.0)

## 2013-06-28 LAB — BASIC METABOLIC PANEL
CO2: 29 mEq/L (ref 19–32)
Calcium: 9.2 mg/dL (ref 8.4–10.5)
Creatinine, Ser: 0.9 mg/dL (ref 0.4–1.5)

## 2013-06-28 LAB — HEMOGLOBIN A1C: Hgb A1c MFr Bld: 6.3 % (ref 4.6–6.5)

## 2013-06-29 ENCOUNTER — Encounter: Payer: Self-pay | Admitting: *Deleted

## 2013-07-05 ENCOUNTER — Ambulatory Visit: Payer: BC Managed Care – PPO | Admitting: Family Medicine

## 2013-07-07 ENCOUNTER — Other Ambulatory Visit: Payer: Self-pay | Admitting: Family Medicine

## 2013-07-12 ENCOUNTER — Encounter: Payer: Self-pay | Admitting: Family Medicine

## 2013-07-12 ENCOUNTER — Ambulatory Visit (INDEPENDENT_AMBULATORY_CARE_PROVIDER_SITE_OTHER): Payer: BC Managed Care – PPO | Admitting: Family Medicine

## 2013-07-12 VITALS — BP 128/82 | HR 84 | Temp 98.2°F | Wt 265.8 lb

## 2013-07-12 DIAGNOSIS — E119 Type 2 diabetes mellitus without complications: Secondary | ICD-10-CM

## 2013-07-12 DIAGNOSIS — E785 Hyperlipidemia, unspecified: Secondary | ICD-10-CM

## 2013-07-12 DIAGNOSIS — Z23 Encounter for immunization: Secondary | ICD-10-CM

## 2013-07-12 MED ORDER — PRAVASTATIN SODIUM 40 MG PO TABS: 40.0000 mg | ORAL_TABLET | Freq: Every day | ORAL | Status: DC

## 2013-07-12 NOTE — Progress Notes (Signed)
  Subjective:    Patient ID: Brian Watson, male    DOB: 04-04-1976, 37 y.o.   MRN: [id number]  HPI CC: 3 mo f/u DM  DM - recent dx.  After breakfast in am, 100-130.  Diet change - avoiding breads and pastas.  Drinking diet dr pepper.  Declines DMSE.  Eye exam 10/2012.  Foot exam 02/2013.  No low sugars.  Denies vision changes or paresthesias.    HLD - reviewed Watson work.  Improved with better glycemic control but still not at goal.   Past Medical History  Diagnosis Date  . Generalized headaches     frequent  . GERD (gastroesophageal reflux disease)   . HTN (hypertension)   . H/O Bell's palsy 2011  . Smoker     cutting back  . Obesity   . Dyslipidemia   . Transaminitis   . Diabetes type 2, uncontrolled 02/25/2013  . Anxiety 03/09/2012     Review of Systems Per HPI    Objective:   Physical Exam  Nursing note and vitals reviewed. Constitutional: He appears well-developed and well-nourished. No distress.  HENT:  Mouth/Throat: Oropharynx is clear and moist. No oropharyngeal exudate.  Eyes: Conjunctivae and EOM are normal. Pupils are equal, round, and reactive to light. No scleral icterus.  Some injection bilaterally  Neck: Normal range of motion. Neck supple.  Cardiovascular: Normal rate, regular rhythm, normal heart sounds and intact distal pulses.   No murmur heard. Pulmonary/Chest: Effort normal and breath sounds normal. No respiratory distress. He has no wheezes. He has no rales.  Musculoskeletal: He exhibits no edema.  Lymphadenopathy:    He has no cervical adenopathy.  Psychiatric: He has a normal mood and affect.       Assessment & Plan:

## 2013-07-12 NOTE — Addendum Note (Signed)
Addended by: Josph Macho A on: 07/12/2013 10:14 AM   Modules accepted: Orders

## 2013-07-12 NOTE — Assessment & Plan Note (Signed)
Discussed lipids not at goal with new dx DM - start pravastatin 40mg  daily. Discussed side effects to monitor. rtc 3 mo for recheck LFT and FLP.

## 2013-07-12 NOTE — Patient Instructions (Addendum)
Flu and pneumonia shots today. Great job with sugars!  Continue metformin. Diabetes is currently under control. Let's start pravastatin 40mg  daily for better cholesterol control. Return in 3 months for fasting blood work and afterwards for office visit.

## 2013-07-12 NOTE — Assessment & Plan Note (Signed)
Relatively new dx - significant improvement over last 3 months!  Now essentially controlled. Pneumovax today. Pt declines DSME referral today. rtc 3 mo for recheck. utd eye exam and foot exam.

## 2013-07-13 ENCOUNTER — Telehealth: Payer: Self-pay

## 2013-07-13 MED ORDER — FLUTICASONE PROPIONATE 50 MCG/ACT NA SUSP: 2.0000 | Freq: Every day | NASAL | Status: DC

## 2013-07-13 NOTE — Telephone Encounter (Signed)
Jon Gills said pt was seen on 07/12/13 and pt thought a nasal spray was to be sent to [facility]. For sinus problem. [facility] does not have rx. Alexis request cb to pt at [phone].Marland Kitchen Spoke with Jon Gills and will send request to Dr Reece Agar. Alexis said cb tomorrow is OK.

## 2013-07-13 NOTE — Telephone Encounter (Signed)
Yes we talked about allergic rhinitis - I will send flonase to pharmacy - watch for nosebleeds rec restart claritin and nasal saline OTC.

## 2013-07-14 NOTE — Telephone Encounter (Signed)
Patient notified

## 2013-11-17 ENCOUNTER — Encounter (INDEPENDENT_AMBULATORY_CARE_PROVIDER_SITE_OTHER): Payer: Self-pay

## 2013-11-17 ENCOUNTER — Encounter: Payer: Self-pay | Admitting: Pulmonary Disease

## 2013-11-17 ENCOUNTER — Telehealth: Payer: Self-pay | Admitting: Pulmonary Disease

## 2013-11-17 ENCOUNTER — Ambulatory Visit (INDEPENDENT_AMBULATORY_CARE_PROVIDER_SITE_OTHER): Payer: BC Managed Care – PPO | Admitting: Pulmonary Disease

## 2013-11-17 VITALS — BP 152/82 | HR 81 | Temp 98.1°F | Ht 72.0 in | Wt 278.8 lb

## 2013-11-17 DIAGNOSIS — G4733 Obstructive sleep apnea (adult) (pediatric): Secondary | ICD-10-CM

## 2013-11-17 NOTE — Assessment & Plan Note (Signed)
The patient is doing very well on CPAP from a pressure standpoint, and is sleeping well with adequate daytime alertness. His only complaint today is that he is having mask fitting issues, and has been unable to get his home care company to work with him on this. I will refer him to the sleep Center for a formal mask fitting, and will also arrange for a new home care company. I've encouraged the patient to work aggressively on weight loss, and also to discontinue his smoking.

## 2013-11-17 NOTE — Telephone Encounter (Signed)
[facility] Order placed to change DME companies as pt is very dissatisfied Per pt's chart, order was sent to [facility] for the DME change however no specific needs were included in the order Per pt's chart, pt wears CPAP at 13cm Called [facility], spoke with Selena BattenKim and the above was discussed Order placed to [facility] with the above information

## 2013-11-17 NOTE — Progress Notes (Signed)
   Subjective:    Patient ID: Brian Watson, male    DOB: March 28, 1976, 38 y.o.   MRN: [id number]  HPI The patient comes in today for followup of his obstructive sleep apnea. He is wearing CPAP compliantly, and is having no issues with his pressure. He is having an issue with his mask fit, and has not been able to get his home care company to work with him on improved fitting. He feels that he does very well with respect to sleep, and has seen significant improvement in daytime alertness. He has lost 8 pounds since the last visit.   Review of Systems  Constitutional: Positive for fever. Negative for unexpected weight change.  HENT: Positive for congestion, postnasal drip, rhinorrhea and sore throat. Negative for dental problem, ear pain, nosebleeds, sinus pressure, sneezing and trouble swallowing.   Eyes: Positive for redness and itching.  Respiratory: Negative for cough, chest tightness, shortness of breath and wheezing.   Cardiovascular: Negative for palpitations and leg swelling.  Gastrointestinal: Negative for nausea and vomiting.  Genitourinary: Negative for dysuria.  Musculoskeletal: Negative for joint swelling.  Skin: Negative for rash.  Neurological: Positive for headaches.  Hematological: Does not bruise/bleed easily.  Psychiatric/Behavioral: Negative for dysphoric mood. The patient is not nervous/anxious.        Objective:   Physical Exam Morbidly obese male in no acute distress Nose without purulence or discharge noted No skin breakdown or pressure necrosis from the CPAP mask Neck without lymphadenopathy or thyromegaly Lower extremities without edema, no cyanosis Alert, does not appear to be sleepy, moves all 4 extremities.       Assessment & Plan:

## 2013-11-17 NOTE — Patient Instructions (Signed)
Continue with cpap, and will refer you to the sleep center for mask fitting. Will change your homecare company since you are not satisfied with your current one. Work on weight loss followup with me again in one year if doing well, but please call if you continue to have issues.

## 2013-12-06 ENCOUNTER — Other Ambulatory Visit (HOSPITAL_BASED_OUTPATIENT_CLINIC_OR_DEPARTMENT_OTHER): Payer: BC Managed Care – PPO

## 2014-03-09 ENCOUNTER — Other Ambulatory Visit: Payer: Self-pay | Admitting: Family Medicine

## 2014-03-20 ENCOUNTER — Telehealth: Payer: Self-pay | Admitting: Pulmonary Disease

## 2014-03-20 DIAGNOSIS — G4733 Obstructive sleep apnea (adult) (pediatric): Secondary | ICD-10-CM

## 2014-03-20 NOTE — Telephone Encounter (Signed)
[id number] x1 

## 2014-03-21 NOTE — Telephone Encounter (Signed)
Spoke with the pt's spouse  She states that the pt has needed CPAP supplies since Jan  We sent [facility] an order in Feb, and apparently they never received this  She states pt wants to go back with [facility] for supplies  Order was sent to [facility]

## 2014-03-24 ENCOUNTER — Other Ambulatory Visit: Payer: Self-pay | Admitting: Family Medicine

## 2014-04-10 ENCOUNTER — Other Ambulatory Visit: Payer: Self-pay | Admitting: Family Medicine

## 2014-04-11 ENCOUNTER — Telehealth: Payer: Self-pay | Admitting: Family Medicine

## 2014-04-11 NOTE — Telephone Encounter (Signed)
Error

## 2014-04-21 ENCOUNTER — Telehealth: Payer: Self-pay | Admitting: *Deleted

## 2014-04-21 NOTE — Telephone Encounter (Signed)
**  DIABETIC BUNDLE**  Left voicemail requesting pt to call office and schedule a f/u with Dr. Reece AgarG (will need fasting lab appt. prior or pt can be fasting at f/u appt so we can do labs then)

## 2014-05-18 ENCOUNTER — Ambulatory Visit (INDEPENDENT_AMBULATORY_CARE_PROVIDER_SITE_OTHER): Payer: BC Managed Care – PPO | Admitting: Family Medicine

## 2014-05-18 ENCOUNTER — Encounter: Payer: Self-pay | Admitting: Family Medicine

## 2014-05-18 VITALS — BP 140/80 | HR 87 | Temp 97.8°F | Wt 258.0 lb

## 2014-05-18 DIAGNOSIS — G909 Disorder of the autonomic nervous system, unspecified: Secondary | ICD-10-CM

## 2014-05-18 DIAGNOSIS — E1149 Type 2 diabetes mellitus with other diabetic neurological complication: Secondary | ICD-10-CM

## 2014-05-18 DIAGNOSIS — E1143 Type 2 diabetes mellitus with diabetic autonomic (poly)neuropathy: Secondary | ICD-10-CM

## 2014-05-18 DIAGNOSIS — E1142 Type 2 diabetes mellitus with diabetic polyneuropathy: Secondary | ICD-10-CM

## 2014-05-18 DIAGNOSIS — IMO0002 Reserved for concepts with insufficient information to code with codable children: Secondary | ICD-10-CM

## 2014-05-18 DIAGNOSIS — E1140 Type 2 diabetes mellitus with diabetic neuropathy, unspecified: Secondary | ICD-10-CM

## 2014-05-18 DIAGNOSIS — E1165 Type 2 diabetes mellitus with hyperglycemia: Principal | ICD-10-CM

## 2014-05-18 LAB — HM DIABETES FOOT EXAM

## 2014-05-18 MED ORDER — METFORMIN HCL 500 MG PO TABS: 1000.0000 mg | ORAL_TABLET | Freq: Two times a day (BID) | ORAL | Status: DC

## 2014-05-18 MED ORDER — INSULIN GLARGINE 100 UNIT/ML SOLOSTAR PEN: 15.0000 [IU] | PEN_INJECTOR | Freq: Every day | SUBCUTANEOUS | Status: DC

## 2014-05-18 NOTE — Assessment & Plan Note (Signed)
Improved with sugar control per pt. Discussed use of gabapentin, pt will hold off for now . Reconsider if pain in feet not improving as expected as DM control improves

## 2014-05-18 NOTE — Patient Instructions (Addendum)
Increase metformin to 2 tabs twice day. Start lantus solostar pens at 15 units daily. Follow fasting bloods sugar (Goal 80-120) and check 2 hours after a meal (goal < 180). Too low is 60 ... If this happens drink 4 oz of OJ, then check again 15 min later, if still low repeat OJ and  check in another 15 min. If still low go to ER or call MD. Always check blood sugar a few hours after a low. Folow up PCP in 2 weeks DM check, with fasting labs prior. Bring in measurements.

## 2014-05-18 NOTE — Progress Notes (Signed)
   Subjective:    Patient ID: Brian Watson, male    DOB: 05/02/1976, 38 y.o.   MRN: [id number]  HPI  38 year old male pt of Dr. Patrice ParadiseGuiterrez with history of DM type 2 presents with blood sugar elevation.   He has not had appt with Dr. Reece AgarG for DM since last year 06/2014. DM was diagnosed last year. Lab Results  Component Value Date   HGBA1C 6.3 06/28/2013   He is on metformin 500 mg 1 tab in AM and 2 tabs in PM. No SE.  He reports that fasting blood sugar 280,  later in day 230-418 in last few months. He has been working on DM diet. Exercises occ but he is limited with foot pain. He has noted burning pain in feet in last few months. It is worse when blood sugfar high. He has also noted urinary urgency  He has been drinking a lot of water.  Father with DM... "I know what diet to use"  Has lsot 20 lbs in last year.  Wt Readings from Last 3 Encounters:  05/18/14 258 lb (117.028 kg)  11/17/13 278 lb 12.8 oz (126.463 kg)  07/12/13 265 lb 12 oz (120.543 kg)      Review of Systems  Constitutional: Positive for fatigue. Negative for fever.       Thirsty.  HENT: Negative for ear pain.   Eyes: Negative for pain.  Respiratory: Negative for cough and shortness of breath.   Cardiovascular: Negative for chest pain and leg swelling.  Gastrointestinal: Negative for abdominal pain.  Genitourinary: Positive for frequency.       Objective:   Physical Exam  Constitutional: Vital signs are normal. He appears well-developed and well-nourished.  Obese, centrally  HENT:  Head: Normocephalic.  Right Ear: Hearing normal.  Left Ear: Hearing normal.  Nose: Nose normal.  Mouth/Throat: Oropharynx is clear and moist and mucous membranes are normal.  Neck: Trachea normal. Carotid bruit is not present. No mass and no thyromegaly present.  Cardiovascular: Normal rate, regular rhythm and normal pulses.  Exam reveals no gallop, no distant heart sounds and no friction rub.   No murmur  heard. No peripheral edema  Pulmonary/Chest: Effort normal and breath sounds normal. No respiratory distress.  Skin: Skin is warm, dry and intact. No rash noted.  Psychiatric: He has a normal mood and affect. His speech is normal and behavior is normal. Thought content normal.   Diabetic foot exam: Normal inspection No skin breakdown No calluses  Normal DP pulses Normal sensation to light touch and monofilament, pain in B soles Nails thickened        Assessment & Plan:

## 2014-05-18 NOTE — Assessment & Plan Note (Addendum)
Poor control. Discussed treatment options in detail. Chose to increase metofrmin to max and to add insulin ( Lantus Solostar Pen) at 15 units daily. Will likely need to increase insulin at next OV. Close follow up with PCP in 2 weeks, will bring in measurements. Overdue for eval of risk factors such as lipids,microalbumin also due for A1C.  Total visit time 30 minutes, > 50% spent counseling and cordinating patients care and discussing insulin use.  Pt refused nutrition referral. Encouraged exercise, weight loss, healthy eating habits.

## 2014-05-19 ENCOUNTER — Telehealth: Payer: Self-pay | Admitting: Family Medicine

## 2014-05-19 NOTE — Telephone Encounter (Signed)
Relevant patient education assigned to patient using Emmi. ° °

## 2014-05-26 ENCOUNTER — Other Ambulatory Visit: Payer: BC Managed Care – PPO

## 2014-05-28 ENCOUNTER — Other Ambulatory Visit: Payer: Self-pay | Admitting: Family Medicine

## 2014-06-02 ENCOUNTER — Ambulatory Visit: Payer: BC Managed Care – PPO | Admitting: Family Medicine

## 2014-06-02 DIAGNOSIS — Z0289 Encounter for other administrative examinations: Secondary | ICD-10-CM

## 2014-07-08 ENCOUNTER — Other Ambulatory Visit: Payer: Self-pay | Admitting: Family Medicine

## 2014-08-11 ENCOUNTER — Other Ambulatory Visit: Payer: Self-pay | Admitting: Family Medicine

## 2014-08-12 ENCOUNTER — Other Ambulatory Visit: Payer: Self-pay | Admitting: Family Medicine

## 2014-08-27 ENCOUNTER — Other Ambulatory Visit: Payer: Self-pay | Admitting: Family Medicine

## 2014-09-11 ENCOUNTER — Other Ambulatory Visit: Payer: Self-pay | Admitting: Family Medicine

## 2014-09-27 ENCOUNTER — Ambulatory Visit (INDEPENDENT_AMBULATORY_CARE_PROVIDER_SITE_OTHER): Payer: BC Managed Care – PPO | Admitting: Family Medicine

## 2014-09-27 ENCOUNTER — Encounter: Payer: Self-pay | Admitting: Family Medicine

## 2014-09-27 VITALS — BP 130/78 | HR 80 | Temp 98.4°F | Wt 262.5 lb

## 2014-09-27 DIAGNOSIS — IMO0002 Reserved for concepts with insufficient information to code with codable children: Secondary | ICD-10-CM

## 2014-09-27 DIAGNOSIS — E1165 Type 2 diabetes mellitus with hyperglycemia: Secondary | ICD-10-CM

## 2014-09-27 DIAGNOSIS — R21 Rash and other nonspecific skin eruption: Secondary | ICD-10-CM

## 2014-09-27 DIAGNOSIS — E1140 Type 2 diabetes mellitus with diabetic neuropathy, unspecified: Secondary | ICD-10-CM

## 2014-09-27 DIAGNOSIS — F419 Anxiety disorder, unspecified: Secondary | ICD-10-CM

## 2014-09-27 DIAGNOSIS — I10 Essential (primary) hypertension: Secondary | ICD-10-CM

## 2014-09-27 DIAGNOSIS — E785 Hyperlipidemia, unspecified: Secondary | ICD-10-CM

## 2014-09-27 LAB — COMPREHENSIVE METABOLIC PANEL
ALT: 158 U/L — ABNORMAL HIGH (ref 0–53)
AST: 222 U/L — AB (ref 0–37)
Albumin: 4.2 g/dL (ref 3.5–5.2)
Alkaline Phosphatase: 76 U/L (ref 39–117)
BILIRUBIN TOTAL: 1 mg/dL (ref 0.2–1.2)
BUN: 10 mg/dL (ref 6–23)
CALCIUM: 9.2 mg/dL (ref 8.4–10.5)
CHLORIDE: 102 meq/L (ref 96–112)
CO2: 26 mEq/L (ref 19–32)
CREATININE: 0.8 mg/dL (ref 0.4–1.5)
GFR: 119.45 mL/min (ref >60.00)
Glucose, Bld: 174 mg/dL — ABNORMAL HIGH (ref 70–99)
Potassium: 4.3 mEq/L (ref 3.5–5.1)
Sodium: 134 mEq/L — ABNORMAL LOW (ref 135–145)
TOTAL PROTEIN: 7.6 g/dL (ref 6.0–8.3)

## 2014-09-27 LAB — MICROALBUMIN / CREATININE URINE RATIO
Creatinine,U: 188.4 mg/dL
MICROALB/CREAT RATIO: 0.6 mg/g (ref 0.0–30.0)
Microalb, Ur: 1.1 mg/dL (ref 0.0–1.9)

## 2014-09-27 LAB — LIPID PANEL
CHOL/HDL RATIO: 7
Cholesterol: 181 mg/dL (ref 0–200)
HDL: 27.3 mg/dL — ABNORMAL LOW (ref >39.00)
NONHDL: 153.7
TRIGLYCERIDES: 208 mg/dL — AB (ref 0.0–149.0)
VLDL: 41.6 mg/dL — ABNORMAL HIGH (ref 0.0–40.0)

## 2014-09-27 LAB — LDL CHOLESTEROL, DIRECT: Direct LDL: 131.3 mg/dL

## 2014-09-27 LAB — HEMOGLOBIN A1C: Hgb A1c MFr Bld: 7 % — ABNORMAL HIGH (ref 4.6–6.5)

## 2014-09-27 MED ORDER — SERTRALINE HCL 50 MG PO TABS: 75.0000 mg | ORAL_TABLET | Freq: Every day | ORAL | Status: DC

## 2014-09-27 MED ORDER — TRIAMCINOLONE ACETONIDE 0.1 % EX CREA: 1.0000 "application " | TOPICAL_CREAM | Freq: Two times a day (BID) | CUTANEOUS | Status: DC

## 2014-09-27 MED ORDER — AMLODIPINE BESYLATE 10 MG PO TABS: ORAL_TABLET | ORAL | Status: DC

## 2014-09-27 MED ORDER — METFORMIN HCL 1000 MG PO TABS: 1000.0000 mg | ORAL_TABLET | Freq: Two times a day (BID) | ORAL | Status: DC

## 2014-09-27 MED ORDER — OMEPRAZOLE 40 MG PO CPDR: DELAYED_RELEASE_CAPSULE | ORAL | Status: DC

## 2014-09-27 NOTE — Assessment & Plan Note (Signed)
Very overdue for f/u. Reviewed meds again. Check labwork today. Foot exam today. rtc 3 mo f/u DM. Pt agrees.

## 2014-09-27 NOTE — Assessment & Plan Note (Signed)
Chronic, stable.  Continue amlodipine 10mg 

## 2014-09-27 NOTE — Telephone Encounter (Signed)
Pt request refill pen needles to [facility] [address]. Spoke with Marisue Hum[facility]ureen at [facility] [address] and pt has available refills and will get refill ready for pick up. Pt voiced understanding.

## 2014-09-27 NOTE — Assessment & Plan Note (Signed)
Did not tolerate pravastatin. Recheck FLP today and then may suggest another statin trial.

## 2014-09-27 NOTE — Progress Notes (Signed)
Pre visit review using our clinic review tool, if applicable. No additional management support is needed unless otherwise documented below in the visit note. 

## 2014-09-27 NOTE — Assessment & Plan Note (Signed)
Anticipate steroid responsive dermatosis - treat with TCI cream. Update if not improving. Pt agrees with plan. Less likely focal plaque psoriasis.

## 2014-09-27 NOTE — Patient Instructions (Addendum)
For rash - I wonder about dry skin dermatitis or possible small areas of psoriasis - try stronger topical steroid to spots. If worsening let me know. Blood work today. meds refilled today. Good to see you, call us with questions. Return as needed or in 3 months for diabetes follow up Bring log of sugars 1 week prior to appointment - some times fasting and some times 2 hours after a meal.

## 2014-09-27 NOTE — Assessment & Plan Note (Signed)
Increase zoloft to 75mg  daily. Pt agrees with plan.

## 2014-09-27 NOTE — Progress Notes (Signed)
BP 130/78 mmHg  Pulse 80  Temp(Src) 98.4 F (36.9 C) (Oral)  Wt 262 lb 8 oz (119.069 kg)   CC: rash  Subjective:    Patient ID: Brian Watson, male    DOB: 1976-07-01, 38 y.o.   MRN: [id number]  HPI: Brian Watson is a 38 y.o. male presenting on 09/27/2014 for Sores on skin   1 mo h/o rash on R shoulder and back. Comes and goes. Dry flaky rash. Never with prior similar rash. Mother with h/o psoriasis. Denies new medicines. No new lotions, detergents, soaps or shampoos. No new foods. No oral lesions, fevers.  Has treated with cortisone and neosporin but hasn't helped.   DM - started on lantus by Dr Ermalene SearingBedsole 05/2014 - lantus 15u. also taking metformin 1000mg  bid. Highest 205, lowest sugar 130. Checks sugars daily fasting. Endorses paresthesias/burning of feet when sugars improved. Lab Results  Component Value Date   HGBA1C 6.3 06/28/2013   Diabetic Foot Exam - Simple   Simple Foot Form  Diabetic Foot exam was performed with the following findings:  Yes 09/27/2014 11:10 AM  Visual Inspection  No deformities, no ulcerations, no other skin breakdown bilaterally:  Yes  Sensation Testing  Intact to touch and monofilament testing bilaterally:  Yes  Pulse Check  Posterior Tibialis and Dorsalis pulse intact bilaterally:  Yes  Comments       HLD - pravastatin caused body aches. Stopped remotely. Anxiety sxs may be returning - asks about increased sertraline dose.  Relevant past medical, surgical, family and social history reviewed and updated as indicated. Interim medical history since our last visit reviewed. Allergies and medications reviewed and updated.  Current Outpatient Prescriptions on File Prior to Visit  Medication Sig  . B-D UF III MINI PEN NEEDLES 31G X 5 MM MISC INJECT ONCE DAILY  . glucose blood (ONE TOUCH TEST STRIPS) test strip Use as instructed  . ibuprofen (ADVIL,MOTRIN) 200 MG tablet Take 200 mg by mouth as needed.  . Insulin Glargine  (LANTUS) 100 UNIT/ML Solostar Pen Inject 15 Units into the skin daily at 10 pm. (Patient taking differently: Inject 15 Units into the skin daily. )  . loratadine (CLARITIN) 10 MG tablet Take 10 mg by mouth daily.  Marland Kitchen. triamcinolone (NASACORT) 55 MCG/ACT AERO nasal inhaler Place 2 sprays into the nose daily.   No current facility-administered medications on file prior to visit.    Review of Systems Per HPI unless specifically indicated above     Objective:    BP 130/78 mmHg  Pulse 80  Temp(Src) 98.4 F (36.9 C) (Oral)  Wt 262 lb 8 oz (119.069 kg)  Wt Readings from Last 3 Encounters:  09/27/14 262 lb 8 oz (119.069 kg)  05/18/14 258 lb (117.028 kg)  11/17/13 278 lb 12.8 oz (126.463 kg)    Physical Exam  Constitutional: He appears well-developed and well-nourished. No distress.  HENT:  Head: Normocephalic and atraumatic.  Right Ear: External ear normal.  Left Ear: External ear normal.  Nose: Nose normal.  Mouth/Throat: Oropharynx is clear and moist. No oropharyngeal exudate.  Eyes: Conjunctivae and EOM are normal. Pupils are equal, round, and reactive to light. No scleral icterus.  Neck: Normal range of motion. Neck supple.  Cardiovascular: Normal rate, regular rhythm, normal heart sounds and intact distal pulses.   No murmur heard. Pulmonary/Chest: Effort normal and breath sounds normal. No respiratory distress. He has no wheezes. He has no rales.  Musculoskeletal: He exhibits no edema.  See  HPI for foot exam if done  Lymphadenopathy:    He has no cervical adenopathy.  Skin: Skin is warm and dry. Rash noted.  2 separate spots  R superior shoulder/neck region with erythematous patch of scaly dry skin, pruritic R posterior upper back with patch of scaly dry skin, some lichenification present.  Psychiatric: He has a normal mood and affect.  Nursing note and vitals reviewed.  Results for orders placed or performed in visit on 05/18/14  HM DIABETES FOOT EXAM  Result Value Ref  Range   HM Diabetic Foot Exam done       Assessment & Plan:   Problem List Items Addressed This Visit    Type 2 diabetes, uncontrolled, with neuropathy    Very overdue for f/u. Reviewed meds again. Check labwork today. Foot exam today. rtc 3 mo f/u DM. Pt agrees.    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 Update if not improving. Pt agrees with plan. Less likely focal plaque psoriasis.    HTN (hypertension)    Chronic, stable. Continue amlodipine 10mg .    Relevant Medications      amLODIpine (NORVASC) tablet   Dyslipidemia    Did not tolerate pravastatin. Recheck FLP today and then may suggest another statin trial.    Relevant Orders      Lipid panel      Comprehensive metabolic panel   Anxiety    Increase zoloft to 75mg  daily. Pt agrees with plan.    Relevant Medications      sertraline (ZOLOFT) tablet       Follow up plan: Return in about 3 months (around 12/27/2014), or as needed, for follow up visit.

## 2014-09-28 ENCOUNTER — Telehealth: Payer: Self-pay | Admitting: Family Medicine

## 2014-09-28 ENCOUNTER — Other Ambulatory Visit: Payer: Self-pay | Admitting: Family Medicine

## 2014-09-28 DIAGNOSIS — R7401 Elevation of levels of liver transaminase levels: Secondary | ICD-10-CM

## 2014-09-28 DIAGNOSIS — R740 Nonspecific elevation of levels of transaminase and lactic acid dehydrogenase [LDH]: Principal | ICD-10-CM

## 2014-09-28 DIAGNOSIS — E785 Hyperlipidemia, unspecified: Secondary | ICD-10-CM

## 2014-09-28 MED ORDER — ATORVASTATIN CALCIUM 10 MG PO TABS: 10.0000 mg | ORAL_TABLET | Freq: Every day | ORAL | Status: DC

## 2014-09-28 NOTE — Telephone Encounter (Signed)
emmi emailed °

## 2014-10-23 ENCOUNTER — Telehealth: Payer: Self-pay

## 2014-10-23 ENCOUNTER — Ambulatory Visit: Payer: Self-pay | Admitting: Family Medicine

## 2014-10-23 DIAGNOSIS — K8020 Calculus of gallbladder without cholecystitis without obstruction: Secondary | ICD-10-CM

## 2014-10-23 DIAGNOSIS — K7581 Nonalcoholic steatohepatitis (NASH): Secondary | ICD-10-CM

## 2014-10-23 DIAGNOSIS — N289 Disorder of kidney and ureter, unspecified: Secondary | ICD-10-CM

## 2014-10-23 NOTE — Telephone Encounter (Signed)
[facility] partial report for US of abdomen;Exam limited by pt's habitus. At lease 2 gallstones measuring up to 2.7 cms. One gall stone appears mobile however 2.3 cm gall stone in GB neck region does not change position with change of pts's position. It may be lodged in the neck. Mild GB wall thickening between 3.2 or 4.4 mm. No pericholecystic fluid; per US technologist the pt was not tender over this region during scanning and clinical correlation is recommended. [facility] will fax complete report. The faxed report has been received and placed in Dr Sharen HonesGutierrez in box for review. Pt is not waiting at radiology.Please advise.

## 2014-10-24 ENCOUNTER — Encounter: Payer: Self-pay | Admitting: Family Medicine

## 2014-10-24 DIAGNOSIS — N289 Disorder of kidney and ureter, unspecified: Secondary | ICD-10-CM | POA: Insufficient documentation

## 2014-10-24 NOTE — Telephone Encounter (Addendum)
Reviewed US report - plz notify pt: 1. gallstones with one stone possibly stuck in gallbladder neck - any fever, nausea/vomiting, RUQ abominal pain? If so would need urgent re eval. If no symptoms ok to monitor. 2. enlarged fatty liver - need to work on weight loss and continued cholesterol and sugar control. Will further discuss at next office visit. 3. 2cm L kidney lesion unable to determine etiology on US, likely kidney cyst but radiologist suggested CT scan to better evaluate - if pt willing I will order this.

## 2014-10-24 NOTE — Telephone Encounter (Signed)
Patient notified. No fever,n/v or abd pain. Advised to call right away if any occurs. He verbalized understanding. He is agreeable to CT. [facility]

## 2014-10-25 DIAGNOSIS — N289 Disorder of kidney and ureter, unspecified: Secondary | ICD-10-CM | POA: Insufficient documentation

## 2014-10-25 DIAGNOSIS — K8020 Calculus of gallbladder without cholecystitis without obstruction: Secondary | ICD-10-CM | POA: Insufficient documentation

## 2014-10-25 NOTE — Addendum Note (Signed)
Addended by: Eustaquio BoydenGUTIERREZ, Meria Crilly on: 10/25/2014 09:26 AM   Modules accepted: Orders

## 2014-10-25 NOTE — Telephone Encounter (Addendum)
CT abd/pelvis with and without contrast ordered to f/u 2.1cm LEFT kidney lesion.

## 2014-10-31 ENCOUNTER — Other Ambulatory Visit: Payer: Self-pay | Admitting: Family Medicine

## 2014-10-31 DIAGNOSIS — N289 Disorder of kidney and ureter, unspecified: Secondary | ICD-10-CM

## 2014-10-31 DIAGNOSIS — K8020 Calculus of gallbladder without cholecystitis without obstruction: Secondary | ICD-10-CM

## 2014-11-01 ENCOUNTER — Ambulatory Visit: Payer: Self-pay | Admitting: Family Medicine

## 2014-11-02 ENCOUNTER — Encounter: Payer: Self-pay | Admitting: Family Medicine

## 2014-11-02 ENCOUNTER — Other Ambulatory Visit: Payer: Self-pay | Admitting: Family Medicine

## 2014-11-02 DIAGNOSIS — K7460 Unspecified cirrhosis of liver: Secondary | ICD-10-CM

## 2014-11-08 ENCOUNTER — Other Ambulatory Visit: Payer: BLUE CROSS/BLUE SHIELD

## 2014-11-10 ENCOUNTER — Other Ambulatory Visit: Payer: BLUE CROSS/BLUE SHIELD

## 2014-11-13 ENCOUNTER — Ambulatory Visit: Payer: BLUE CROSS/BLUE SHIELD | Admitting: Family Medicine

## 2014-11-16 ENCOUNTER — Other Ambulatory Visit: Payer: BLUE CROSS/BLUE SHIELD

## 2014-11-16 LAB — HM DIABETES EYE EXAM

## 2014-11-17 ENCOUNTER — Ambulatory Visit: Payer: BC Managed Care – PPO | Admitting: Pulmonary Disease

## 2014-11-18 ENCOUNTER — Encounter: Payer: Self-pay | Admitting: Family Medicine

## 2014-11-21 ENCOUNTER — Ambulatory Visit: Payer: BLUE CROSS/BLUE SHIELD | Admitting: Family Medicine

## 2014-11-22 ENCOUNTER — Ambulatory Visit: Payer: BLUE CROSS/BLUE SHIELD | Admitting: Family Medicine

## 2014-11-23 ENCOUNTER — Other Ambulatory Visit: Payer: BLUE CROSS/BLUE SHIELD

## 2014-11-27 ENCOUNTER — Ambulatory Visit: Payer: BLUE CROSS/BLUE SHIELD | Admitting: Family Medicine

## 2014-12-29 ENCOUNTER — Ambulatory Visit: Payer: BC Managed Care – PPO | Admitting: Family Medicine

## 2014-12-29 ENCOUNTER — Telehealth: Payer: Self-pay | Admitting: Family Medicine

## 2014-12-29 NOTE — Telephone Encounter (Signed)
Patient did not come for their scheduled appointment today for 3 month follow up Please let me know if the patient needs to be contacted immediately for follow up or if no follow up is necessary.   ° °

## 2014-12-29 NOTE — Telephone Encounter (Signed)
Yes we need to reschedule

## 2015-01-01 NOTE — Telephone Encounter (Signed)
Appointment 4/1 pt aware °

## 2015-01-01 NOTE — Telephone Encounter (Signed)
Left message asking pt to call office  °

## 2015-01-08 ENCOUNTER — Other Ambulatory Visit: Payer: Self-pay | Admitting: *Deleted

## 2015-01-08 ENCOUNTER — Other Ambulatory Visit: Payer: Self-pay

## 2015-01-08 MED ORDER — INSULIN PEN NEEDLE 31G X 5 MM MISC: Status: DC

## 2015-01-08 MED ORDER — INSULIN GLARGINE 100 UNIT/ML SOLOSTAR PEN: 15.0000 [IU] | PEN_INJECTOR | Freq: Every day | SUBCUTANEOUS | Status: DC

## 2015-01-08 NOTE — Telephone Encounter (Signed)
Brian Watson is [facility] acct [facility] and [facility]; if pt gets at local pharmacy will cost pt over $300.00 with new ins. Advised rx sent. Pt has f/u appt on 01/12/15.

## 2015-01-09 ENCOUNTER — Telehealth: Payer: Self-pay | Admitting: Family Medicine

## 2015-01-09 MED ORDER — INSULIN PEN NEEDLE 31G X 5 MM MISC: Status: DC

## 2015-01-09 NOTE — Telephone Encounter (Signed)
Patient's wife is asking for you to call her back about the issue you discussed with her yesterday.

## 2015-01-09 NOTE — Telephone Encounter (Addendum)
error 

## 2015-01-09 NOTE — Telephone Encounter (Signed)
I didn't speak to patient's wife about anything. Did you?

## 2015-01-09 NOTE — Telephone Encounter (Addendum)
No I didn't but pt due for f/u lantus sent in yesterday - may be refill related

## 2015-01-09 NOTE — Telephone Encounter (Addendum)
Spoke with patient's wife and she had spoken with [facility] and they needed verification in order to send meds through mail order. Called and spoke with [facility] and they just needed the okay to refill pen needles to go along with his Lantus pen. Okayed refill x 1 year. Has follow up scheduled.

## 2015-01-09 NOTE — Addendum Note (Signed)
Addended by: Josph MachoANCE, Mona Ayars A on: 01/09/2015 02:59 PM   Modules accepted: Orders

## 2015-01-12 ENCOUNTER — Ambulatory Visit: Payer: BLUE CROSS/BLUE SHIELD | Admitting: Family Medicine

## 2015-01-12 DIAGNOSIS — Z0289 Encounter for other administrative examinations: Secondary | ICD-10-CM

## 2015-03-28 ENCOUNTER — Other Ambulatory Visit: Payer: Self-pay | Admitting: Family Medicine

## 2015-04-17 ENCOUNTER — Telehealth: Payer: Self-pay

## 2015-04-17 NOTE — Telephone Encounter (Signed)
Pt and his wife are separating and pt will not have ins next month until can get new ins. Pt will contact pharmacies and see if needs refills on any meds and see what meds pt can get filled this month. Pt will cb if needed.

## 2015-08-22 IMAGING — CT CT ABDOMEN WO/W CM
3 of 10 series · 11 of 46 positions shown, 17 images · IV contrast (omnipaque)
Comparison: 10/23/2014.

CLINICAL DATA: Evaluate gallbladder and left kidney lesion.

EXAM:
CT ABDOMEN WITHOUT AND WITH CONTRAST
TECHNIQUE: Multidetector CT imaging of the abdomen was performed following the
standard protocol before and following the bolus administration of
intravenous contrast.
CONTRAST:  115 cc of Omnipaque 350

[Series 2: renal without · axial · non-contrast · 0.90mm/px · z∈[-1071,-855]mm · 7 of 144 slices shown, 12 images]
[im 18/144  soft-tissue]
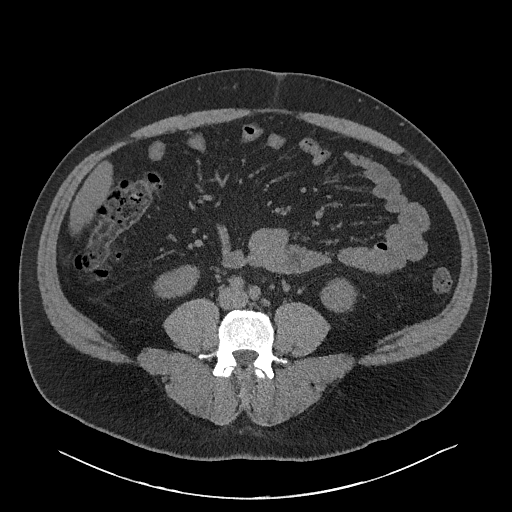
[im 18/144  bone]
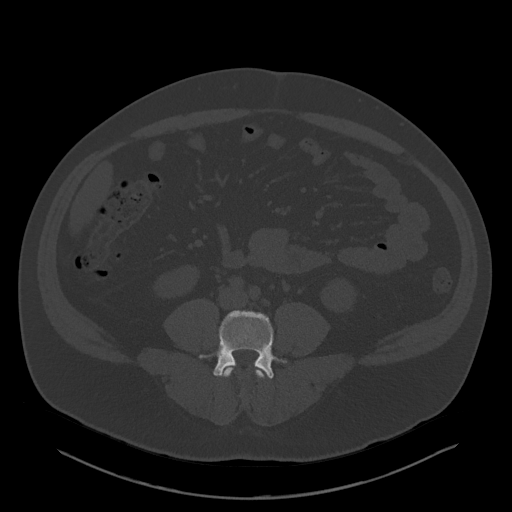
[im 36/144  soft-tissue]
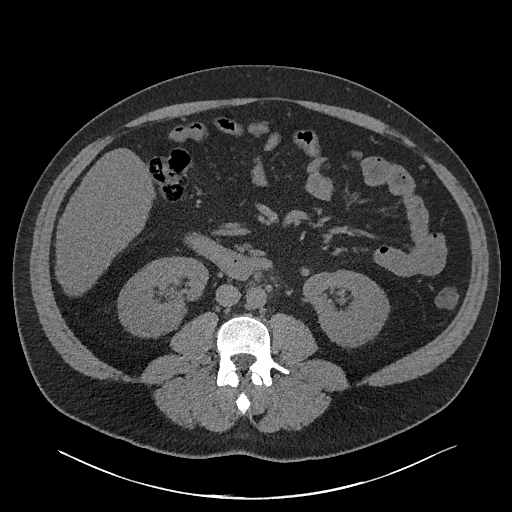
[im 54/144  soft-tissue]
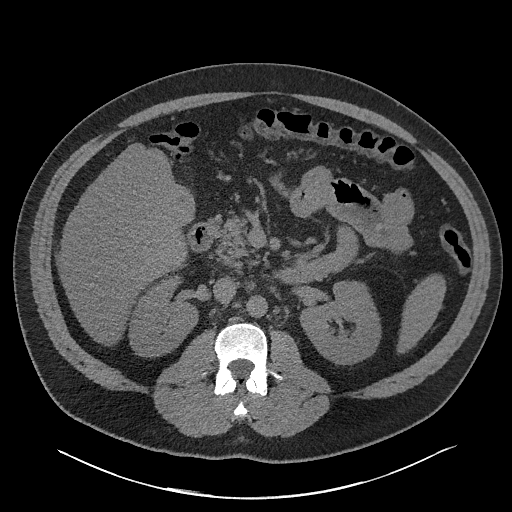
[im 72/144  soft-tissue]
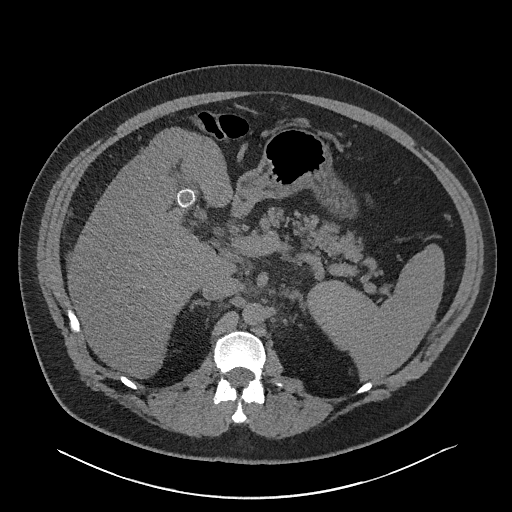
[im 72/144  lung]
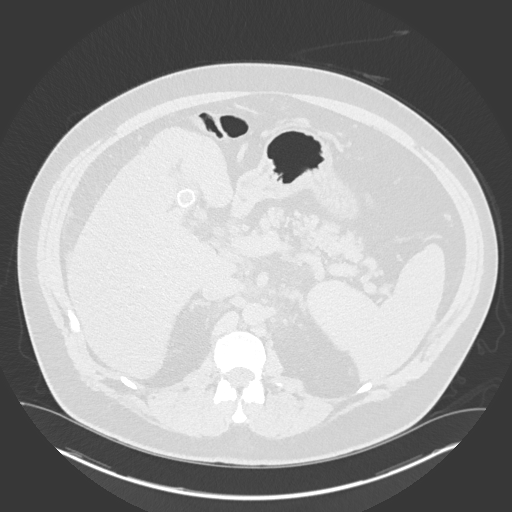
[im 90/144  soft-tissue]
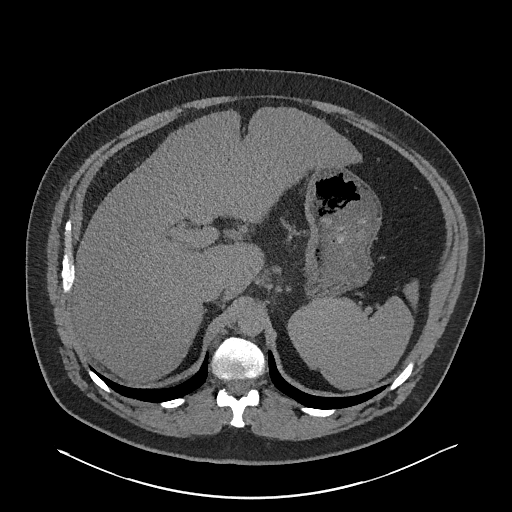
[im 90/144  lung]
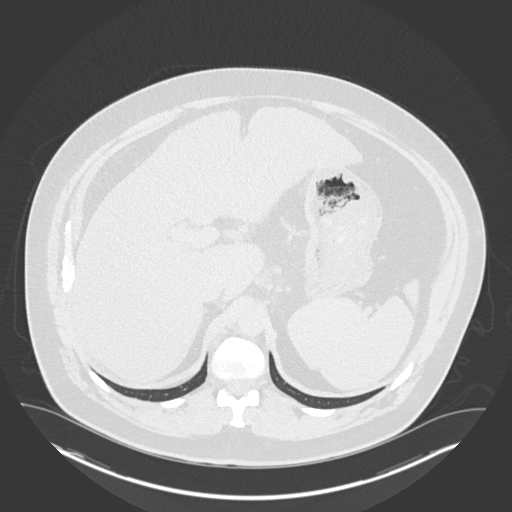
[im 108/144  soft-tissue]
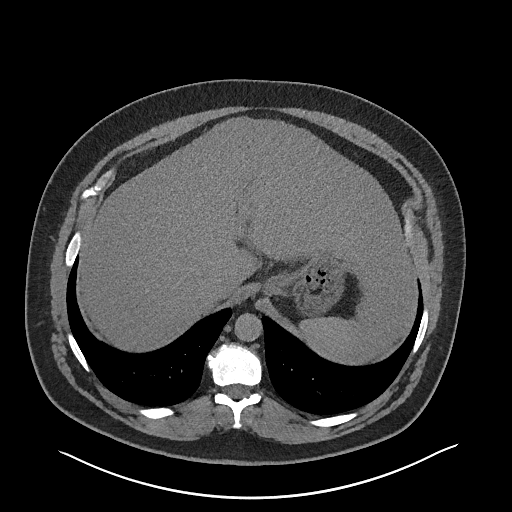
[im 108/144  lung]
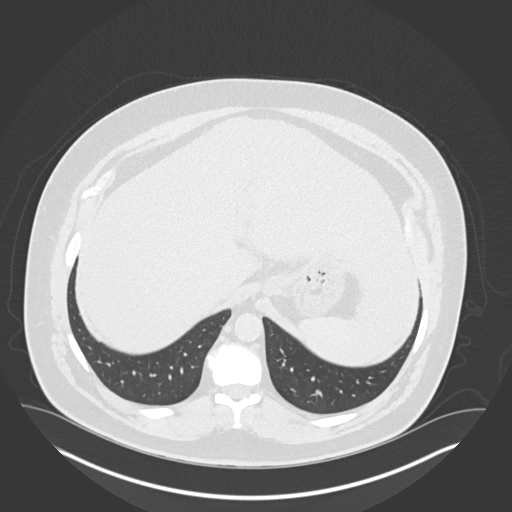
[im 126/144  soft-tissue]
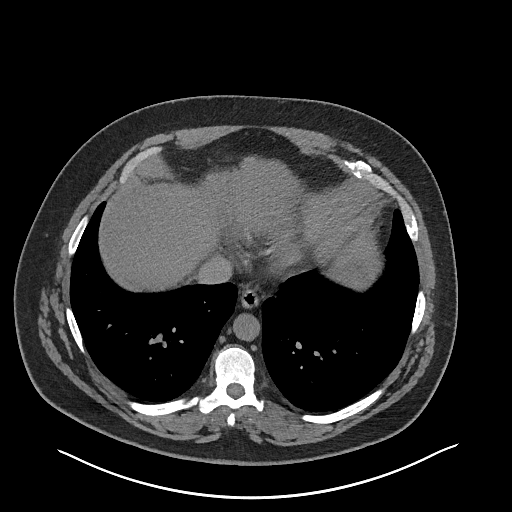
[im 126/144  lung]
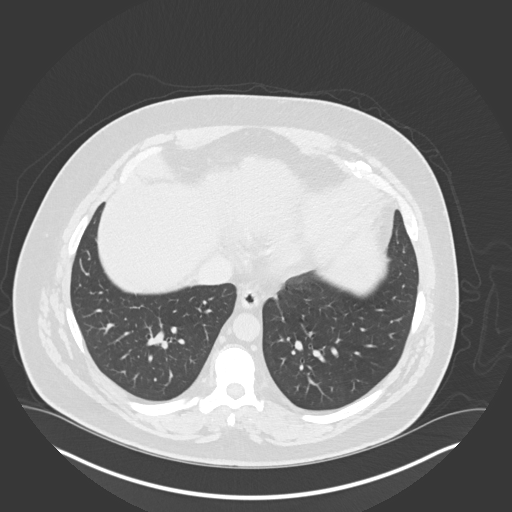

[Series 3: renal arterial · axial · arterial · 0.91mm/px · z∈[-1071,-1035]mm · 2 of 148 slices shown]
[im 19/148  soft-tissue]
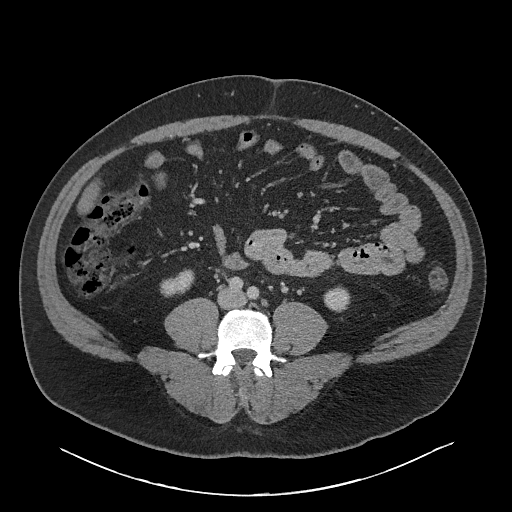
[im 37/148  soft-tissue]
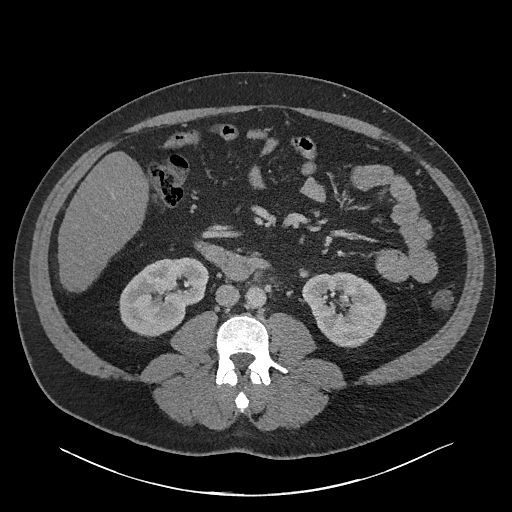

[Series 8: renal without cor · coronal · non-contrast · 0.56mm/px · 2 of 169 slices shown, 3 images]
[im 57/169  soft-tissue]
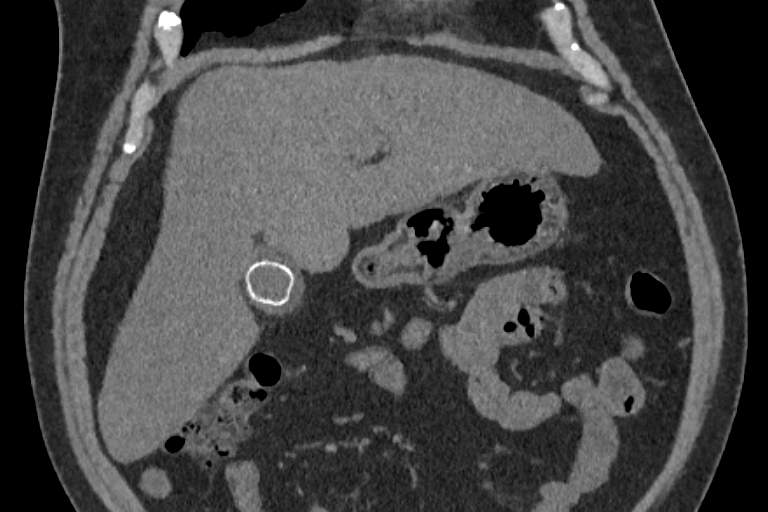
[im 57/169  bone]
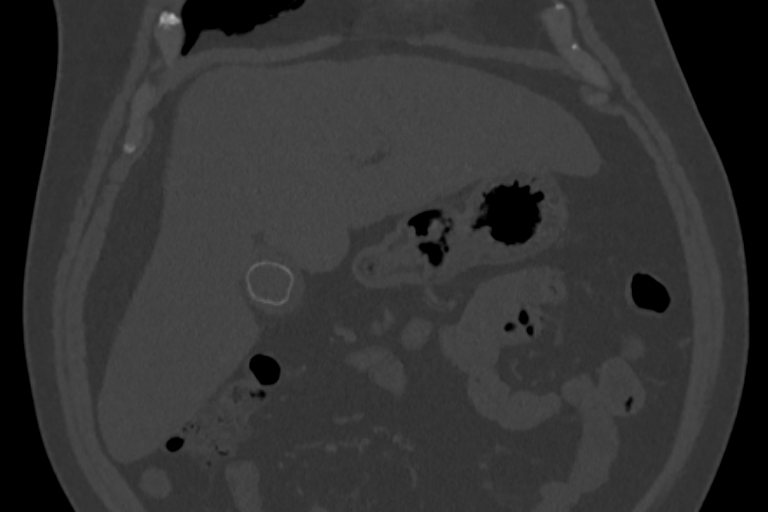
[im 113/169  soft-tissue]
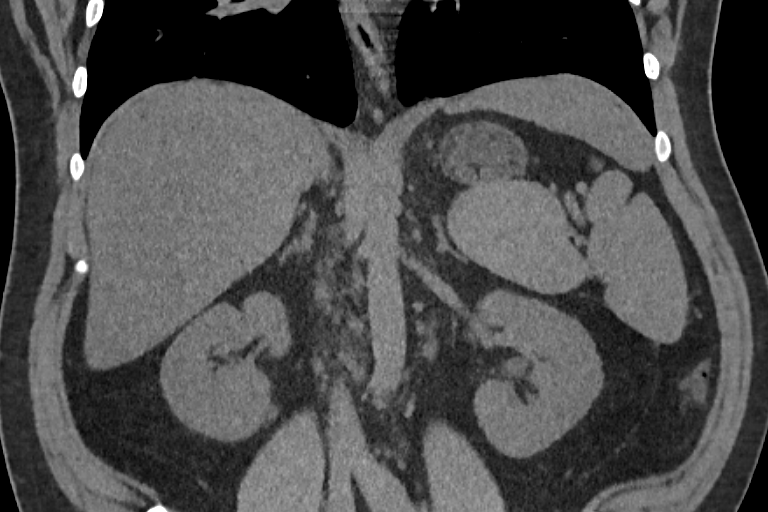

[11 of 46 positions shown; findings below may reference images not displayed]

FINDINGS: Lower chest:  No pleural effusion.  The lung bases appear clear.

Hepatobiliary: Hypertrophy of the lateral segment of left lobe of
liver and caudate lobe of liver noted. The liver has a slightly
irregular contour or. Ill-defined low-attenuation structure in the
lateral segment of left lobe of liver measures 1.1 cm, image
45/series 3. Low-attenuation structure within the posterior right
hepatic lobe is identified measuring 7 mm, image 67/series 3. This
is also too small to characterize. Right hepatic lobe hypodensity is
too small to characterize measuring 6 mm, image 90/series 5.
Multiple stones are identified within the gallbladder. No biliary
dilatation identified.

Pancreas: Normal appearance of the pancreas.

Spleen: The spleen is enlarged measuring 13 cm in cranial caudal
dimension.

Adrenals/Urinary Tract: Normal appearance of the adrenal glands. No
suspicious kidney lesions identified.

Stomach/Bowel: The stomach is normal. The small bowel loops are
unremarkable.

Vascular/Lymphatic: Mild calcified atherosclerotic disease involves
the abdominal aorta. There is mild soft tissue stranding within the
retroperitoneum. Prominent upper abdominal lymph nodes are
identified. Gastrohepatic ligament lymph node measures 1.1 cm, image
64/ series 3. Portacaval lymph node measures 1.5 cm, image 77/series
3. Porta hepatic lymph node measures 1.1 cm, image 59/series 3.

Other: No ascites identified within the upper abdomen.

Musculoskeletal: Review of the visualized osseous structures is
significant for mild spondylosis.
IMPRESSION: 1. There are morphologic features of liver concerning for cirrhosis.
2. A few scattered low-attenuation foci within the liver are
nonspecific and too small to characterize. No hyper enhancing liver
lesions noted. If further evaluation is clinically indicated then a
contrast enhanced MRI would be recommended.
3. Multiple borderline enlarged lymph nodes are identified within
the upper abdomen. In the setting of cirrhosis this may be a
nonspecific finding. Reactive adenopathy, lymphoproliferative
disorder and metastatic adenopathy or also differential
considerations. Clinical correlation is advised.
4. Gallstones.
5. Splenomegaly

## 2021-01-02 ENCOUNTER — Ambulatory Visit: Payer: BLUE CROSS/BLUE SHIELD | Admitting: Dermatology

## 2023-07-13 ENCOUNTER — Ambulatory Visit
Admission: RE | Admit: 2023-07-13 | Discharge: 2023-07-13 | Disposition: A | Discharge status: Home / Self Care | Payer: Self-pay | Source: Ambulatory Visit | Attending: Emergency Medicine | Admitting: Emergency Medicine

## 2023-07-13 ENCOUNTER — Ambulatory Visit (INDEPENDENT_AMBULATORY_CARE_PROVIDER_SITE_OTHER): Payer: BLUE CROSS/BLUE SHIELD

## 2023-07-13 VITALS — BP 139/90 | HR 100 | Temp 98.2°F | Resp 18

## 2023-07-13 DIAGNOSIS — R058 Other specified cough: Secondary | ICD-10-CM | POA: Diagnosis not present

## 2023-07-13 DIAGNOSIS — J0100 Acute maxillary sinusitis, unspecified: Secondary | ICD-10-CM

## 2023-07-13 DIAGNOSIS — J209 Acute bronchitis, unspecified: Secondary | ICD-10-CM

## 2023-07-13 DIAGNOSIS — F17200 Nicotine dependence, unspecified, uncomplicated: Secondary | ICD-10-CM

## 2023-07-13 MED ORDER — AMOXICILLIN-POT CLAVULANATE 875-125 MG PO TABS: 1.0000 | ORAL_TABLET | Freq: Two times a day (BID) | ORAL | 0 refills | Status: AC

## 2023-07-13 NOTE — ED Triage Notes (Signed)
Patient to Urgent Care with complaints of chest congestion x3 weeks. Brown/ yellow and thick sputum production. Worse at night.   Reports taking Mucinex/ sudafed/ nasal spray/ zyrtec.

## 2023-07-13 NOTE — ED Provider Notes (Signed)
[name]    CSN: [id number] Arrival date & time: 07/13/23  1426      History   Chief Complaint Chief Complaint  Patient presents with   Cough    Chest congestion for 3 weeks, taking mucinex, Nasal spray and zyrtec. Not getting better. Wheezing and weak. - Entered by patient    HPI Brian Watson is a 47 y.o. male.  Patient presents with 3-week history of congestion and productive cough.  The cough is worse at night.  It is productive of brown and yellow sputum.  He denies fever, shortness of breath, chest pain, or other symptoms.  Various OTC sinus and allergy medications attempted without relief.  His medical history includes hypertension, diabetes, obstructive sleep apnea, current everyday smoker. The history is provided by the patient and medical records.    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 No pertinent surgical history.     Home Medications    Prior to Admission medications   Medication Sig Start Date End Date Taking?  Authorizing Provider  amoxicillin-clavulanate (AUGMENTIN) 875-125 MG tablet Take 1 tablet by mouth every 12 (twelve) hours for 10 days. 07/13/23 07/23/23 Yes Mickie Bail, NP  amLODipine (NORVASC) 10 MG tablet Take one tablet every day 09/27/14   Eustaquio Boyden, MD  atorvastatin (LIPITOR) 10 MG tablet Take one tablet daily. **MUST HAVE FOLLOW UP FOR FURTHER REFILLS** Patient not taking: Reported on 07/13/2023 03/29/15   Eustaquio Boyden, MD  glucose blood (ONE TOUCH TEST STRIPS) test strip Use as instructed Patient not taking: Reported on 07/13/2023 02/25/13   Eustaquio Boyden, MD  ibuprofen (ADVIL,MOTRIN) 200 MG tablet Take 200 mg by mouth as needed.    [provider]  Insulin Glargine (LANTUS) 100 UNIT/ML Solostar Pen Inject 15 Units into the skin daily at 10 pm. Patient not taking: Reported on 07/13/2023 01/08/15   Eustaquio Boyden, MD  Insulin Pen Needle (B-D UF III MINI PEN NEEDLES) 31G X 5 MM MISC INJECT ONCE DAILY Patient not taking: Reported on 07/13/2023 01/09/15   Eustaquio Boyden, MD  loratadine (CLARITIN) 10 MG tablet Take 10 mg by mouth daily.    [provider]  metFORMIN (GLUCOPHAGE) 1000 MG tablet Take 1 tablet (1,000 mg total) by mouth 2 (two) times daily with a meal. Patient not taking: Reported on 07/13/2023 09/27/14   Eustaquio Boyden, MD  omeprazole (PRILOSEC) 40 MG capsule TAKE 1 CAPSULE BY MOUTH EVERY DAY 09/27/14   Eustaquio Boyden,  MD  sertraline (ZOLOFT) 50 MG tablet Take 1.5 tablets (75 mg total) by mouth daily. Patient not taking: Reported on 07/13/2023 09/27/14   Eustaquio Boyden, MD  triamcinolone (NASACORT) 55 MCG/ACT AERO nasal inhaler Place 2 sprays into the nose daily.    [provider]  triamcinolone cream (KENALOG) 0.1 % Apply 1 application topically 2 (two) times daily. Apply to AA. 09/27/14   Eustaquio Boyden, MD    Family History Family History  Problem Relation Age of Onset   Hypertension Mother    Hyperlipidemia Mother     Diabetes Father    Hyperlipidemia Father    Hypertension Father    Coronary artery disease Paternal Grandfather    Stroke Neg Hx    Cancer Neg Hx    Coronary artery disease Father 59       smoking, severe diabetes   Coronary artery disease Paternal Uncle     Social History Social History   Tobacco Use   Smoking status: Every Day    Current packs/day: 0.50    Average packs/day: 0.5 packs/day for 14.0 years (7.0 ttl pk-yrs)    Types: Cigarettes   Smokeless tobacco: Never  Substance Use Topics   Alcohol use: Yes    Comment: Daily (2 drinks after work)   Drug use: No     Allergies   Patient has no known allergies.   Review of Systems Review of Systems  Constitutional:  Negative for chills and fever.  HENT:  Positive for congestion, postnasal drip and rhinorrhea. Negative for ear pain and sore throat.   Respiratory:  Positive for cough. Negative for shortness of breath.   Cardiovascular:  Negative for chest pain and palpitations.     Physical Exam Triage Vital Signs ED Triage Vitals  Encounter Vitals Group     BP 07/13/23 1503 (!) 139/90     Systolic BP Percentile --      Diastolic BP Percentile --      Pulse Rate 07/13/23 1456 100     Resp 07/13/23 1456 18     Temp 07/13/23 1456 98.2 F (36.8 C)     Temp src --      SpO2 07/13/23 1456 97 %     Weight --      Height --      Head Circumference --      Peak Flow --      Pain Score 07/13/23 1459 2     Pain Loc --      Pain Education --      Exclude from Growth Chart --    No data found.  Updated Vital Signs BP (!) 139/90   Pulse 100   Temp 98.2 F (36.8 C)   Resp 18   SpO2 97%   Visual Acuity Right Eye Distance:   Left Eye Distance:   Bilateral Distance:    Right Eye Near:   Left Eye Near:    Bilateral Near:     Physical Exam Constitutional:      General: He is not in acute distress. HENT:     Right Ear: Tympanic membrane normal.     Left Ear: Tympanic membrane normal.     Nose:  Congestion and rhinorrhea present.     Mouth/Throat:     Mouth: Mucous membranes are moist.     Pharynx: Oropharynx is clear.  Cardiovascular:     Rate and Rhythm: Normal rate and regular rhythm.     Heart sounds: Normal heart sounds.  Pulmonary:     Effort: Pulmonary effort is normal. No respiratory distress.     Breath sounds: Normal breath sounds.  Skin:    General: Skin is warm and dry.  Neurological:     Mental Status: He is alert.  Psychiatric:        Mood and Affect: Mood normal.        Behavior: Behavior normal.      UC Treatments / Results  Labs (all labs ordered are listed, but only abnormal results are displayed) Labs Reviewed - No data to display  EKG   Radiology DG Chest 2 View  Result Date: 07/13/2023 CLINICAL DATA:  Productive cough and chest congestion. EXAM: CHEST - 2 VIEW COMPARISON:  03/15/2012 FINDINGS: Mild cardiomegaly. Mild peribronchial thickening. There is trace fluid in the fissures without sub pulmonic effusion. No focal airspace disease. No pneumothorax. Mild chronic wedging of lower thoracic vertebra. IMPRESSION: 1. Mild cardiomegaly. 2. Trace fluid in the fissures without large subpulmonic effusion. Recommend correlation for fluid overload. 3. Mild peribronchial thickening. Electronically Signed   By: Narda Rutherford M.D.   On: 07/13/2023 17:43    Procedures Procedures (including critical care time)  Medications Ordered in UC Medications - No data to display  Initial Impression / Assessment and Plan / UC Course  I have reviewed the triage vital signs and the nursing notes.  Pertinent labs & imaging results that were available during my care of the patient were reviewed by me and considered in my medical decision making (see chart for details).    Productive cough, current everyday smoker, acute bronchitis, acute sinusitis.  No respiratory distress, O2 sat 97% on room air.  Treating sinusitis and bronchitis with 10-day course of Augmentin.   Chest x-ray does not show pneumonia; it does show mild cardiomegaly and trace fluid.  Discussed x-ray findings with patient and instructed him to follow-up with his primary care provider tomorrow.  ED precautions discussed.  He agrees to plan of care.  Final Clinical Impressions(s) / UC Diagnoses   Final diagnoses:  Productive cough  Smoker  Acute bronchitis, unspecified organism  Acute non-recurrent maxillary sinusitis     Discharge Instructions      Take the Augmentin as directed.  Follow-up with your primary care provider.      ED Prescriptions     Medication Sig Dispense Auth. Provider   amoxicillin-clavulanate (AUGMENTIN) 875-125 MG tablet Take 1 tablet by mouth every 12 (twelve) hours for 10 days. 20 tablet Mickie Bail, NP      PDMP not reviewed this encounter.   Mickie Bail, NP 07/13/23 1755

## 2023-07-13 NOTE — Discharge Instructions (Addendum)
Take the Augmentin as directed.  Follow up with your primary care provider.    

## 2023-08-04 ENCOUNTER — Ambulatory Visit: Payer: Self-pay | Admitting: Internal Medicine

## 2023-08-04 ENCOUNTER — Encounter: Payer: Self-pay | Admitting: Internal Medicine

## 2023-08-04 VITALS — BP 119/72 | HR 101 | Ht 71.0 in | Wt 222.4 lb

## 2023-08-04 DIAGNOSIS — I10 Essential (primary) hypertension: Secondary | ICD-10-CM

## 2023-08-04 DIAGNOSIS — E1169 Type 2 diabetes mellitus with other specified complication: Secondary | ICD-10-CM

## 2023-08-04 DIAGNOSIS — G4733 Obstructive sleep apnea (adult) (pediatric): Secondary | ICD-10-CM

## 2023-08-04 DIAGNOSIS — K7581 Nonalcoholic steatohepatitis (NASH): Secondary | ICD-10-CM

## 2023-08-04 MED ORDER — AMLODIPINE BESYLATE 10 MG PO TABS: ORAL_TABLET | ORAL | 0 refills | Status: DC

## 2023-08-04 NOTE — Progress Notes (Unsigned)
Established Patient Office Visit  Subjective:  Patient ID: Brian Watson, male    DOB: 1976-05-23  Age: 47 y.o. MRN: [id number]  No chief complaint on file.   Here to re-establish IM care, recently treated for acute bronchitis and found to be in uncontrolled hypertension. Resumed old Rx of Amlodipine with improvement in his bp.     No other concerns at this time.   Past Medical History:  Diagnosis Date   Anxiety 03/09/2012   Diabetes type 2, uncontrolled 02/25/2013   Dyslipidemia    Generalized headaches    frequent   GERD (gastroesophageal reflux disease)    H/O Bell'Jerick Khachatryan palsy 2011   HTN (hypertension)    Obesity    Smoker    cutting back   Transaminitis     History reviewed. No pertinent surgical history.  Social History   Socioeconomic History   Marital status: Married    Spouse name: Not on file   Number of children: 2   Years of education: Not on file   Highest education level: Not on file  Occupational History   Occupation: GENERAL MANAGER    Employer: [employer]  Tobacco Use   Smoking status: Every Day    Current packs/day: 0.50    Average packs/day: 0.5 packs/day for 14.0 years (7.0 ttl pk-yrs)    Types: Cigarettes   Smokeless tobacco: Never  Vaping Use   Vaping status: Never Used  Substance and Sexual Activity   Alcohol use: Yes    Comment: Daily (2 drinks after work)   Drug use: No   Sexual activity: Yes  Other Topics Concern   Not on file  Social History Narrative   Caffeine: 1 cup coffee/day   Lives with wife and 2 children and mother, 1 dog   Occupation: self employed Holiday representative   Edu: HS   Activity: hunts, fishes, gardens   Diet: tries to keep good diet - daily fruits/vegetables, good water intake, at work poor diet.   Social Determinants of Health   Financial Resource Strain: Not on file  Food Insecurity: Not on file  Transportation Needs: Not on file  Physical Activity: Not on file  Stress: Not on file   Social Connections: Not on file  Intimate Partner Violence: Not on file    Family History  Problem Relation Age of Onset   Hypertension Mother    Hyperlipidemia Mother    Diabetes Father    Hyperlipidemia Father    Hypertension Father    Coronary artery disease Father 35       smoking, severe diabetes   Coronary artery disease Paternal Uncle    Coronary artery disease Maternal Grandfather    Coronary artery disease Paternal Grandfather    Stroke Neg Hx    Cancer Neg Hx     No Known Allergies  Review of Systems  Constitutional:  Positive for weight loss (with diet).  HENT: Negative.    Respiratory: Negative.    Cardiovascular: Negative.   Gastrointestinal: Negative.   Genitourinary: Negative.        Objective:   BP 119/72   Pulse (!) 101   Ht 5\' 11"  (1.803 m)   Wt 222 lb 6.4 oz (100.9 kg)   SpO2 96%   BMI 31.02 kg/m   Vitals:   08/04/23 0945  BP: 119/72  Pulse: (!) 101  Height: 5\' 11"  (1.803 m)  Weight: 222 lb 6.4 oz (100.9 kg)  SpO2: 96%  BMI (Calculated): 31.03  Physical Exam Vitals reviewed.  Constitutional:      Appearance: Normal appearance. He is obese.  HENT:     Head: Normocephalic.     Left Ear: There is no impacted cerumen.     Nose: Nose normal.     Mouth/Throat:     Mouth: Mucous membranes are moist.     Pharynx: No posterior oropharyngeal erythema.  Eyes:     Extraocular Movements: Extraocular movements intact.     Pupils: Pupils are equal, round, and reactive to light.  Cardiovascular:     Rate and Rhythm: Regular rhythm.     Chest Wall: PMI is not displaced.     Pulses: Normal pulses.     Heart sounds: Normal heart sounds. No murmur heard. Pulmonary:     Effort: Pulmonary effort is normal.     Breath sounds: Normal air entry. Rhonchi present. No rales.  Abdominal:     General: Abdomen is protuberant. Bowel sounds are normal. There is no distension.     Palpations: Abdomen is soft. There is no hepatomegaly, splenomegaly or  mass.     Tenderness: There is no abdominal tenderness.  Musculoskeletal:        General: Normal range of motion.     Cervical back: Normal range of motion and neck supple.     Right lower leg: No edema.     Left lower leg: No edema.  Skin:    General: Skin is warm and dry.  Neurological:     General: No focal deficit present.     Mental Status: He is alert and oriented to person, place, and time.     Cranial Nerves: No cranial nerve deficit.     Motor: No weakness.  Psychiatric:        Mood and Affect: Mood normal.        Behavior: Behavior normal.      No results found for any visits on 08/04/23.  No results found for this or any previous visit (from the past 2160 hour(Trevione Wert)).    Assessment & Plan:  As per problem list  Problem List Items Addressed This Visit       Cardiovascular and Mediastinum   HTN (hypertension)   Relevant Medications   amLODipine (NORVASC) 10 MG tablet   Other Relevant Orders   CBC With Diff/Platelet   EKG 12-Lead     Respiratory   OSA (obstructive sleep apnea)   Relevant Orders   Comprehensive metabolic panel     Digestive   NASH (nonalcoholic steatohepatitis)   Other Visit Diagnoses     Type 2 diabetes mellitus with other specified complication, without long-term current use of insulin (HCC)    -  Primary   Relevant Orders   POCT CBG (Fasting - Glucose)   Lipid panel   Hemoglobin A1c       Return in about 3 weeks (around 08/25/2023) for cpe with labs prior.   Total time spent: 30 minutes  Luna Fuse, MD  08/04/2023   This document may have been prepared by [facility] Voice Recognition software and as such may include unintentional dictation errors.

## 2023-08-28 ENCOUNTER — Telehealth: Payer: Self-pay | Admitting: Internal Medicine

## 2023-08-28 ENCOUNTER — Encounter: Payer: Self-pay | Admitting: Internal Medicine

## 2023-08-28 ENCOUNTER — Ambulatory Visit: Payer: Self-pay | Admitting: Internal Medicine

## 2023-08-28 VITALS — BP 134/78 | HR 96 | Ht 71.0 in | Wt 227.0 lb

## 2023-08-28 DIAGNOSIS — I10 Essential (primary) hypertension: Secondary | ICD-10-CM

## 2023-08-28 DIAGNOSIS — E1169 Type 2 diabetes mellitus with other specified complication: Secondary | ICD-10-CM

## 2023-08-28 DIAGNOSIS — G4733 Obstructive sleep apnea (adult) (pediatric): Secondary | ICD-10-CM

## 2023-08-28 DIAGNOSIS — R9431 Abnormal electrocardiogram [ECG] [EKG]: Secondary | ICD-10-CM | POA: Insufficient documentation

## 2023-08-28 LAB — POCT CBG (FASTING - GLUCOSE)-MANUAL ENTRY: Glucose Fasting, POC: 117 mg/dL — AB (ref 70–99)

## 2023-08-28 MED ORDER — ACCU-CHEK GUIDE TEST VI STRP: ORAL_STRIP | 12 refills | Status: AC

## 2023-08-28 MED ORDER — ACCU-CHEK GUIDE W/DEVICE KIT: 1.0000 | PACK | Freq: Every day | 0 refills | Status: AC

## 2023-08-28 MED ORDER — ACCU-CHEK SOFTCLIX LANCETS MISC: 12 refills | Status: AC

## 2023-08-28 NOTE — Telephone Encounter (Signed)
Left VM that they need a new Rx for the glucometer and supplies stating how many times a day he needs to be testing. Please advise.

## 2023-08-28 NOTE — Progress Notes (Signed)
Established Patient Office Visit  Subjective:  Patient ID: Brian Watson, male    DOB: 11-May-1976  Age: 47 y.o. MRN: [id number]  Chief Complaint  Patient presents with   Follow-up    3 week follow up    No new complaints, here for lab review and medication refills. Failed to have previsit labs done.     No other concerns at this time.   Past Medical History:  Diagnosis Date   Anxiety 03/09/2012   Diabetes type 2, uncontrolled 02/25/2013   Dyslipidemia    Generalized headaches    frequent   GERD (gastroesophageal reflux disease)    H/O Bell'Javone Ybanez palsy 2011   HTN (hypertension)    Obesity    Smoker    cutting back   Transaminitis     No past surgical history on file.  Social History   Socioeconomic History   Marital status: Married    Spouse name: Not on file   Number of children: 2   Years of education: Not on file   Highest education level: Not on file  Occupational History   Occupation: GENERAL MANAGER    Employer: [employer]  Tobacco Use   Smoking status: Every Day    Current packs/day: 0.50    Average packs/day: 0.5 packs/day for 14.0 years (7.0 ttl pk-yrs)    Types: Cigarettes   Smokeless tobacco: Never  Vaping Use   Vaping status: Never Used  Substance and Sexual Activity   Alcohol use: Yes    Comment: Daily (2 drinks after work)   Drug use: No   Sexual activity: Yes  Other Topics Concern   Not on file  Social History Narrative   Caffeine: 1 cup coffee/day   Lives with wife and 2 children and mother, 1 dog   Occupation: self employed Holiday representative   Edu: HS   Activity: hunts, fishes, gardens   Diet: tries to keep good diet - daily fruits/vegetables, good water intake, at work poor diet.   Social Determinants of Health   Financial Resource Strain: Not on file  Food Insecurity: Not on file  Transportation Needs: Not on file  Physical Activity: Not on file  Stress: Not on file  Social Connections: Not on file   Intimate Partner Violence: Not on file    Family History  Problem Relation Age of Onset   Hypertension Mother    Hyperlipidemia Mother    Diabetes Father    Hyperlipidemia Father    Hypertension Father    Coronary artery disease Father 58       smoking, severe diabetes   Coronary artery disease Paternal Uncle    Coronary artery disease Maternal Grandfather    Coronary artery disease Paternal Grandfather    Stroke Neg Hx    Cancer Neg Hx     No Known Allergies  Outpatient Medications Prior to Visit  Medication Sig   amLODipine (NORVASC) 10 MG tablet Take one tablet every day   glucose blood (ONE TOUCH TEST STRIPS) test strip Use as instructed (Patient not taking: Reported on 07/13/2023)   [DISCONTINUED] atorvastatin (LIPITOR) 10 MG tablet Take one tablet daily. **MUST HAVE FOLLOW UP FOR FURTHER REFILLS** (Patient not taking: Reported on 07/13/2023)   [DISCONTINUED] ibuprofen (ADVIL,MOTRIN) 200 MG tablet Take 200 mg by mouth as needed. (Patient not taking: Reported on 08/28/2023)   [DISCONTINUED] Insulin Glargine (LANTUS) 100 UNIT/ML Solostar Pen Inject 15 Units into the skin daily at 10 pm. (Patient not taking: Reported on 07/13/2023)   [  DISCONTINUED] Insulin Pen Needle (B-D UF III MINI PEN NEEDLES) 31G X 5 MM MISC INJECT ONCE DAILY (Patient not taking: Reported on 07/13/2023)   [DISCONTINUED] loratadine (CLARITIN) 10 MG tablet Take 10 mg by mouth daily. (Patient not taking: Reported on 08/28/2023)   [DISCONTINUED] metFORMIN (GLUCOPHAGE) 1000 MG tablet Take 1 tablet (1,000 mg total) by mouth 2 (two) times daily with a meal. (Patient not taking: Reported on 07/13/2023)   [DISCONTINUED] omeprazole (PRILOSEC) 40 MG capsule TAKE 1 CAPSULE BY MOUTH EVERY DAY (Patient not taking: Reported on 08/28/2023)   [DISCONTINUED] sertraline (ZOLOFT) 50 MG tablet Take 1.5 tablets (75 mg total) by mouth daily. (Patient not taking: Reported on 07/13/2023)   [DISCONTINUED] triamcinolone (NASACORT) 55 MCG/ACT  AERO nasal inhaler Place 2 sprays into the nose daily. (Patient not taking: Reported on 08/28/2023)   [DISCONTINUED] triamcinolone cream (KENALOG) 0.1 % Apply 1 application topically 2 (two) times daily. Apply to AA. (Patient not taking: Reported on 08/28/2023)   No facility-administered medications prior to visit.    Review of Systems  Constitutional:  Positive for weight loss (with diet).  HENT: Negative.    Respiratory: Negative.    Cardiovascular: Negative.   Gastrointestinal: Negative.   Genitourinary: Negative.        Objective:   BP 134/78   Pulse 96   Ht 5\' 11"  (1.803 m)   Wt 227 lb (103 kg)   SpO2 96%   BMI 31.66 kg/m   Vitals:   08/28/23 1118  BP: 134/78  Pulse: 96  Height: 5\' 11"  (1.803 m)  Weight: 227 lb (103 kg)  SpO2: 96%  BMI (Calculated): 31.67    Physical Exam Vitals reviewed.  Constitutional:      Appearance: Normal appearance. He is obese.  HENT:     Head: Normocephalic.     Left Ear: There is no impacted cerumen.     Nose: Nose normal.     Mouth/Throat:     Mouth: Mucous membranes are moist.     Pharynx: No posterior oropharyngeal erythema.  Eyes:     Extraocular Movements: Extraocular movements intact.     Pupils: Pupils are equal, round, and reactive to light.  Cardiovascular:     Rate and Rhythm: Regular rhythm.     Chest Wall: PMI is not displaced.     Pulses: Normal pulses.     Heart sounds: Normal heart sounds. No murmur heard. Pulmonary:     Effort: Pulmonary effort is normal.     Breath sounds: Normal air entry. Rhonchi present. No rales.  Abdominal:     General: Abdomen is protuberant. Bowel sounds are normal. There is no distension.     Palpations: Abdomen is soft. There is no hepatomegaly, splenomegaly or mass.     Tenderness: There is no abdominal tenderness.  Musculoskeletal:        General: Normal range of motion.     Cervical back: Normal range of motion and neck supple.     Right lower leg: No edema.     Left lower  leg: No edema.  Skin:    General: Skin is warm and dry.  Neurological:     General: No focal deficit present.     Mental Status: He is alert and oriented to person, place, and time.     Cranial Nerves: No cranial nerve deficit.     Motor: No weakness.  Psychiatric:        Mood and Affect: Mood normal.  Behavior: Behavior normal.      Results for orders placed or performed in visit on 08/28/23  POCT CBG (Fasting - Glucose)  Result Value Ref Range   Glucose Fasting, POC 117 (A) 70 - 99 mg/dL    Recent Results (from the past 2160 hour(Ayuub Penley))  POCT CBG (Fasting - Glucose)     Status: Abnormal   Collection Time: 08/28/23 11:23 AM  Result Value Ref Range   Glucose Fasting, POC 117 (A) 70 - 99 mg/dL      Assessment & Plan:  As per problem list  Problem List Items Addressed This Visit       Cardiovascular and Mediastinum   HTN (hypertension)     Respiratory   OSA (obstructive sleep apnea)     Other   Abnormal EKG   Relevant Orders   Ambulatory referral to Cardiology   Other Visit Diagnoses     Type 2 diabetes mellitus with other specified complication, without long-term current use of insulin (HCC)    -  Primary   Relevant Medications   Blood Glucose Monitoring Suppl (ACCU-CHEK GUIDE) w/Device KIT   Accu-Chek Softclix Lancets lancets   glucose blood (ACCU-CHEK GUIDE TEST) test strip   Other Relevant Orders   POCT CBG (Fasting - Glucose) (Completed)       Return in about 9 weeks (around 10/30/2023) for fu with labs prior.   Total time spent: 20 minutes  Luna Fuse, MD  08/28/2023   This document may have been prepared by [facility] Voice Recognition software and as such may include unintentional dictation errors.

## 2023-10-26 ENCOUNTER — Other Ambulatory Visit: Payer: 59

## 2023-10-27 LAB — COMPREHENSIVE METABOLIC PANEL
ALT: 38 [IU]/L (ref 0–44)
AST: 47 [IU]/L — ABNORMAL HIGH (ref 0–40)
Albumin: 4.4 g/dL (ref 4.1–5.1)
Alkaline Phosphatase: 90 [IU]/L (ref 44–121)
BUN/Creatinine Ratio: 12 (ref 9–20)
BUN: 11 mg/dL (ref 6–24)
Bilirubin Total: 0.8 mg/dL (ref 0.0–1.2)
CO2: 22 mmol/L (ref 20–29)
Calcium: 9.5 mg/dL (ref 8.7–10.2)
Chloride: 101 mmol/L (ref 96–106)
Creatinine, Ser: 0.93 mg/dL (ref 0.76–1.27)
Globulin, Total: 2.9 g/dL (ref 1.5–4.5)
Glucose: 127 mg/dL — ABNORMAL HIGH (ref 70–99)
Potassium: 4.3 mmol/L (ref 3.5–5.2)
Sodium: 141 mmol/L (ref 134–144)
Total Protein: 7.3 g/dL (ref 6.0–8.5)
eGFR: 101 mL/min/{1.73_m2} (ref >59)

## 2023-10-27 LAB — CBC WITH DIFF/PLATELET
Basophils Absolute: 0.1 10*3/uL (ref 0.0–0.2)
Basos: 1 %
EOS (ABSOLUTE): 0.1 10*3/uL (ref 0.0–0.4)
Eos: 1 %
Hematocrit: 48.7 % (ref 37.5–51.0)
Hemoglobin: 16.9 g/dL (ref 13.0–17.7)
Immature Grans (Abs): 0 10*3/uL (ref 0.0–0.1)
Immature Granulocytes: 1 %
Lymphocytes Absolute: 1.5 10*3/uL (ref 0.7–3.1)
Lymphs: 18 %
MCH: 35.4 pg — ABNORMAL HIGH (ref 26.6–33.0)
MCHC: 34.7 g/dL (ref 31.5–35.7)
MCV: 102 fL — ABNORMAL HIGH (ref 79–97)
Monocytes Absolute: 0.7 10*3/uL (ref 0.1–0.9)
Monocytes: 9 %
Neutrophils Absolute: 5.8 10*3/uL (ref 1.4–7.0)
Neutrophils: 70 %
Platelets: 134 10*3/uL — ABNORMAL LOW (ref 150–450)
RBC: 4.78 x10E6/uL (ref 4.14–5.80)
RDW: 12.4 % (ref 11.6–15.4)
WBC: 8.3 10*3/uL (ref 3.4–10.8)

## 2023-10-27 LAB — LIPID PANEL
Chol/HDL Ratio: 2.9 {ratio} (ref 0.0–5.0)
Cholesterol, Total: 209 mg/dL — ABNORMAL HIGH (ref 100–199)
HDL: 72 mg/dL (ref >39)
LDL Chol Calc (NIH): 120 mg/dL — ABNORMAL HIGH (ref 0–99)
Triglycerides: 96 mg/dL (ref 0–149)
VLDL Cholesterol Cal: 17 mg/dL (ref 5–40)

## 2023-10-27 LAB — HEMOGLOBIN A1C
Est. average glucose Bld gHb Est-mCnc: 131 mg/dL
Hgb A1c MFr Bld: 6.2 % — ABNORMAL HIGH (ref 4.8–5.6)

## 2023-10-30 ENCOUNTER — Encounter: Payer: Self-pay | Admitting: Internal Medicine

## 2023-10-30 ENCOUNTER — Ambulatory Visit: Payer: 59 | Admitting: Internal Medicine

## 2023-10-30 ENCOUNTER — Other Ambulatory Visit: Payer: Self-pay | Admitting: Internal Medicine

## 2023-10-30 VITALS — BP 130/90 | HR 97 | Temp 98.2°F | Ht 71.0 in | Wt 230.2 lb

## 2023-10-30 DIAGNOSIS — E782 Mixed hyperlipidemia: Secondary | ICD-10-CM | POA: Insufficient documentation

## 2023-10-30 DIAGNOSIS — E119 Type 2 diabetes mellitus without complications: Secondary | ICD-10-CM | POA: Insufficient documentation

## 2023-10-30 DIAGNOSIS — I10 Essential (primary) hypertension: Secondary | ICD-10-CM | POA: Diagnosis not present

## 2023-10-30 DIAGNOSIS — E1169 Type 2 diabetes mellitus with other specified complication: Secondary | ICD-10-CM | POA: Diagnosis not present

## 2023-10-30 LAB — GLUCOSE, POCT (MANUAL RESULT ENTRY): POC Glucose: 129 mg/dL — AB (ref 70–99)

## 2023-10-30 MED ORDER — ROSUVASTATIN CALCIUM 10 MG PO TABS: 10.0000 mg | ORAL_TABLET | Freq: Every day | ORAL | 0 refills | Status: DC

## 2023-10-30 MED ORDER — AMLODIPINE BESY-BENAZEPRIL HCL 10-20 MG PO CAPS: 1.0000 | ORAL_CAPSULE | Freq: Every day | ORAL | 11 refills | Status: DC

## 2023-10-30 NOTE — Progress Notes (Signed)
Established Patient Office Visit  Subjective:  Patient ID: Brian Watson, male    DOB: Dec 02, 1975  Age: 48 y.o. MRN: [id number]  Chief Complaint  Patient presents with   Follow-up    9 weeks    No new complaints, here for lab review and medication refills. Labs reviewed and notable for well controlled diabetes, A1c at target, lipids at target with cmp notable for elevated ALT. Denies any hypoglycemic episodes and home bg readings have been at target. Reports that average bp at home is 135/90.     No other concerns at this time.   Past Medical History:  Diagnosis Date   Anxiety 03/09/2012   Diabetes type 2, uncontrolled 02/25/2013   Dyslipidemia    Generalized headaches    frequent   GERD (gastroesophageal reflux disease)    H/O Bell'Cassie Shedlock palsy 2011   HTN (hypertension)    Obesity    Smoker    cutting back   Transaminitis     No past surgical history on file.  Social History   Socioeconomic History   Marital status: Married    Spouse name: Not on file   Number of children: 2   Years of education: Not on file   Highest education level: Not on file  Occupational History   Occupation: GENERAL MANAGER    Employer: [employer]  Tobacco Use   Smoking status: Every Day    Current packs/day: 0.50    Average packs/day: 0.5 packs/day for 14.0 years (7.0 ttl pk-yrs)    Types: Cigarettes   Smokeless tobacco: Never  Vaping Use   Vaping status: Never Used  Substance and Sexual Activity   Alcohol use: Yes    Comment: Daily (2 drinks after work)   Drug use: No   Sexual activity: Yes  Other Topics Concern   Not on file  Social History Narrative   Caffeine: 1 cup coffee/day   Lives with wife and 2 children and mother, 1 dog   Occupation: self employed Holiday representative   Edu: HS   Activity: hunts, fishes, gardens   Diet: tries to keep good diet - daily fruits/vegetables, good water intake, at work poor diet.   Social Drivers of Research scientist (physical sciences) Strain: Not on file  Food Insecurity: Not on file  Transportation Needs: Not on file  Physical Activity: Not on file  Stress: Not on file  Social Connections: Not on file  Intimate Partner Violence: Not on file    Family History  Problem Relation Age of Onset   Hypertension Mother    Hyperlipidemia Mother    Diabetes Father    Hyperlipidemia Father    Hypertension Father    Coronary artery disease Father 71       smoking, severe diabetes   Coronary artery disease Paternal Uncle    Coronary artery disease Maternal Grandfather    Coronary artery disease Paternal Grandfather    Stroke Neg Hx    Cancer Neg Hx     No Known Allergies  Outpatient Medications Prior to Visit  Medication Sig   Accu-Chek Softclix Lancets lancets Use as instructed   glucose blood (ACCU-CHEK GUIDE TEST) test strip Use as instructed   [DISCONTINUED] amLODipine (NORVASC) 10 MG tablet Take one tablet every day   glucose blood (ONE TOUCH TEST STRIPS) test strip Use as instructed (Patient not taking: Reported on 10/30/2023)   No facility-administered medications prior to visit.    Review of Systems  Constitutional:  Positive  for weight loss (gained 3 lbs).  HENT: Negative.    Respiratory: Negative.    Cardiovascular: Negative.   Gastrointestinal: Negative.   Genitourinary: Negative.        Objective:   BP (!) 130/90   Pulse 97   Temp 98.2 F (36.8 C)   Ht 5\' 11"  (1.803 m)   Wt 230 lb 3.2 oz (104.4 kg)   SpO2 99%   BMI 32.11 kg/m   Vitals:   10/30/23 1033  BP: (!) 130/90  Pulse: 97  Temp: 98.2 F (36.8 C)  Height: 5\' 11"  (1.803 m)  Weight: 230 lb 3.2 oz (104.4 kg)  SpO2: 99%  BMI (Calculated): 32.12    Physical Exam Vitals reviewed.  Constitutional:      Appearance: Normal appearance. He is obese.  HENT:     Head: Normocephalic.     Left Ear: There is no impacted cerumen.     Nose: Nose normal.     Mouth/Throat:     Mouth: Mucous membranes are moist.     Pharynx:  No posterior oropharyngeal erythema.  Eyes:     Extraocular Movements: Extraocular movements intact.     Pupils: Pupils are equal, round, and reactive to light.  Cardiovascular:     Rate and Rhythm: Regular rhythm.     Chest Wall: PMI is not displaced.     Pulses: Normal pulses.     Heart sounds: Normal heart sounds. No murmur heard. Pulmonary:     Effort: Pulmonary effort is normal.     Breath sounds: Normal air entry. Rhonchi present. No rales.  Abdominal:     General: Abdomen is protuberant. Bowel sounds are normal. There is no distension.     Palpations: Abdomen is soft. There is no hepatomegaly, splenomegaly or mass.     Tenderness: There is no abdominal tenderness.  Musculoskeletal:        General: Normal range of motion.     Cervical back: Normal range of motion and neck supple.     Right lower leg: No edema.     Left lower leg: No edema.  Skin:    General: Skin is warm and dry.  Neurological:     General: No focal deficit present.     Mental Status: He is alert and oriented to person, place, and time.     Cranial Nerves: No cranial nerve deficit.     Motor: No weakness.  Psychiatric:        Mood and Affect: Mood normal.        Behavior: Behavior normal.      Results for orders placed or performed in visit on 10/30/23  POCT Glucose (CBG)  Result Value Ref Range   POC Glucose 129 (A) 70 - 99 mg/dl    Recent Results (from the past 2160 hours)  POCT CBG (Fasting - Glucose)     Status: Abnormal   Collection Time: 08/28/23 11:23 AM  Result Value Ref Range   Glucose Fasting, POC 117 (A) 70 - 99 mg/dL  Comprehensive metabolic panel     Status: Abnormal   Collection Time: 10/26/23  9:42 AM  Result Value Ref Range   Glucose 127 (H) 70 - 99 mg/dL   BUN 11 6 - 24 mg/dL   Creatinine, Ser 7.82 0.76 - 1.27 mg/dL   eGFR 956 >21 HY/QMV/7.84   BUN/Creatinine Ratio 12 9 - 20   Sodium 141 134 - 144 mmol/L   Potassium 4.3 3.5 - 5.2 mmol/L  Chloride 101 96 - 106 mmol/L    CO2 22 20 - 29 mmol/L   Calcium 9.5 8.7 - 10.2 mg/dL   Total Protein 7.3 6.0 - 8.5 g/dL   Albumin 4.4 4.1 - 5.1 g/dL   Globulin, Total 2.9 1.5 - 4.5 g/dL   Bilirubin Total 0.8 0.0 - 1.2 mg/dL   Alkaline Phosphatase 90 44 - 121 IU/L   AST 47 (H) 0 - 40 IU/L   ALT 38 0 - 44 IU/L  CBC With Diff/Platelet     Status: Abnormal   Collection Time: 10/26/23  9:42 AM  Result Value Ref Range   WBC 8.3 3.4 - 10.8 x10E3/uL   RBC 4.78 4.14 - 5.80 x10E6/uL   Hemoglobin 16.9 13.0 - 17.7 g/dL   Hematocrit 69.6 29.5 - 51.0 %   MCV 102 (H) 79 - 97 fL   MCH 35.4 (H) 26.6 - 33.0 pg   MCHC 34.7 31.5 - 35.7 g/dL   RDW 28.4 13.2 - 44.0 %   Platelets 134 (L) 150 - 450 x10E3/uL   Neutrophils 70 Not Estab. %   Lymphs 18 Not Estab. %   Monocytes 9 Not Estab. %   Eos 1 Not Estab. %   Basos 1 Not Estab. %   Neutrophils Absolute 5.8 1.4 - 7.0 x10E3/uL   Lymphocytes Absolute 1.5 0.7 - 3.1 x10E3/uL   Monocytes Absolute 0.7 0.1 - 0.9 x10E3/uL   EOS (ABSOLUTE) 0.1 0.0 - 0.4 x10E3/uL   Basophils Absolute 0.1 0.0 - 0.2 x10E3/uL   Immature Granulocytes 1 Not Estab. %   Immature Grans (Abs) 0.0 0.0 - 0.1 x10E3/uL  Lipid panel     Status: Abnormal   Collection Time: 10/26/23  9:42 AM  Result Value Ref Range   Cholesterol, Total 209 (H) 100 - 199 mg/dL   Triglycerides 96 0 - 149 mg/dL   HDL 72 >10 mg/dL   VLDL Cholesterol Cal 17 5 - 40 mg/dL   LDL Chol Calc (NIH) 272 (H) 0 - 99 mg/dL   Chol/HDL Ratio 2.9 0.0 - 5.0 ratio    Comment:                                   T. Chol/HDL Ratio                                             Men  Women                               1/2 Avg.Risk  3.4    3.3                                   Avg.Risk  5.0    4.4                                2X Avg.Risk  9.6    7.1                                3X Avg.Risk 23.4   11.0  Hemoglobin A1c     Status: Abnormal   Collection Time: 10/26/23  9:42 AM  Result Value Ref Range   Hgb A1c MFr Bld 6.2 (H) 4.8 - 5.6 %    Comment:           Prediabetes: 5.7 - 6.4          Diabetes: >6.4          Glycemic control for adults with diabetes: <7.0    Est. average glucose Bld gHb Est-mCnc 131 mg/dL  POCT Glucose (CBG)     Status: Abnormal   Collection Time: 10/30/23 10:39 AM  Result Value Ref Range   POC Glucose 129 (A) 70 - 99 mg/dl      Assessment & Plan:  As per problem list  Problem List Items Addressed This Visit       Cardiovascular and Mediastinum   HTN (hypertension)   Relevant Medications   amLODipine-benazepril (LOTREL) 10-20 MG capsule   rosuvastatin (CRESTOR) 10 MG tablet   Other Relevant Orders   Comprehensive metabolic panel     Endocrine   Diabetes mellitus (HCC) - Primary   Relevant Medications   amLODipine-benazepril (LOTREL) 10-20 MG capsule   rosuvastatin (CRESTOR) 10 MG tablet   Other Relevant Orders   POCT Glucose (CBG) (Completed)   Hemoglobin A1c     Other   Mixed hyperlipidemia   Relevant Medications   amLODipine-benazepril (LOTREL) 10-20 MG capsule   rosuvastatin (CRESTOR) 10 MG tablet   Other Relevant Orders   Lipid panel    Return in about 3 months (around 01/28/2024) for fu with labs prior.   Total time spent: 20 minutes  Luna Fuse, MD  10/30/2023   This document may have been prepared by [facility] Voice Recognition software and as such may include unintentional dictation errors.

## 2023-11-02 ENCOUNTER — Ambulatory Visit: Payer: 59 | Admitting: Cardiovascular Disease

## 2023-11-02 ENCOUNTER — Encounter: Payer: Self-pay | Admitting: Cardiovascular Disease

## 2023-11-02 VITALS — BP 128/84 | HR 99 | Ht 70.0 in | Wt 226.0 lb

## 2023-11-02 DIAGNOSIS — E1169 Type 2 diabetes mellitus with other specified complication: Secondary | ICD-10-CM | POA: Diagnosis not present

## 2023-11-02 DIAGNOSIS — F17210 Nicotine dependence, cigarettes, uncomplicated: Secondary | ICD-10-CM | POA: Diagnosis not present

## 2023-11-02 DIAGNOSIS — I2089 Other forms of angina pectoris: Secondary | ICD-10-CM | POA: Diagnosis not present

## 2023-11-02 DIAGNOSIS — I10 Essential (primary) hypertension: Secondary | ICD-10-CM | POA: Diagnosis not present

## 2023-11-02 DIAGNOSIS — E782 Mixed hyperlipidemia: Secondary | ICD-10-CM

## 2023-11-02 DIAGNOSIS — R0602 Shortness of breath: Secondary | ICD-10-CM

## 2023-11-02 DIAGNOSIS — R9431 Abnormal electrocardiogram [ECG] [EKG]: Secondary | ICD-10-CM

## 2023-11-02 DIAGNOSIS — G4733 Obstructive sleep apnea (adult) (pediatric): Secondary | ICD-10-CM

## 2023-11-02 MED ORDER — AMLODIPINE BESY-BENAZEPRIL HCL 10-20 MG PO CAPS: 1.0000 | ORAL_CAPSULE | Freq: Every day | ORAL | 11 refills | Status: DC

## 2023-11-02 MED ORDER — ROSUVASTATIN CALCIUM 10 MG PO TABS: 10.0000 mg | ORAL_TABLET | Freq: Every day | ORAL | 0 refills | Status: DC

## 2023-11-02 NOTE — Progress Notes (Signed)
Cardiology Office Note   Date:  11/02/2023   ID:  [name], [name] Nov 16, 1975, MRN [id number]  PCP:  Sherron Monday, MD  Cardiologist:  Adrian Blackwater, MD      History of Present Illness: Brian Watson is a 48 y.o. male who presents for  Chief Complaint  Patient presents with   Medication Refill    Pt hasn't been able to pick up the new medication that tejan-sie started him on    This is a 48 year old white male with a past medical history of diabetes hypertension hyperlipidemia and obesity who has recently lost significant amount of weight with improvement of his blood sugars.  He was evaluated by Dr. Kinnie Feil and was sent here because of abnormal EKG.  Patient states that he gets short of breath on minimal exertion but no chest pain.  Medication Refill      Past Medical History:  Diagnosis Date   Anxiety 03/09/2012   Diabetes type 2, uncontrolled 02/25/2013   Dyslipidemia    Generalized headaches    frequent   GERD (gastroesophageal reflux disease)    H/O Bell's palsy 2011   HTN (hypertension)    Obesity    Smoker    cutting back   Transaminitis      History reviewed. No pertinent surgical history.   Current Outpatient Medications  Medication Sig Dispense Refill   Accu-Chek Softclix Lancets lancets Use as instructed 100 each 12   amLODipine-benazepril (LOTREL) 10-20 MG capsule Take 1 capsule by mouth daily. 30 capsule 11   glucose blood (ACCU-CHEK GUIDE TEST) test strip Use as instructed 100 each 12   glucose blood (ONE TOUCH TEST STRIPS) test strip Use as instructed (Patient not taking: Reported on 10/30/2023) 100 each 12   rosuvastatin (CRESTOR) 10 MG tablet Take 1 tablet (10 mg total) by mouth daily. 90 tablet 0   No current facility-administered medications for this visit.    Allergies:   Patient has no known allergies.    Social History:   reports that he has been smoking cigarettes. He has a 7 pack-year smoking history.  He has never used smokeless tobacco. He reports current alcohol use. He reports that he does not use drugs.   Family History:  family history includes Coronary artery disease in his maternal grandfather, paternal grandfather, and paternal uncle; Coronary artery disease (age of onset: 22) in his father; Diabetes in his father; Hyperlipidemia in his father and mother; Hypertension in his father and mother.    ROS:     ROS    All other systems are reviewed and negative.    PHYSICAL EXAM: VS:  BP 128/84   Pulse 99   Ht 5\' 10"  (1.778 m)   Wt 226 lb (102.5 kg)   SpO2 98%   BMI 32.43 kg/m  , BMI Body mass index is 32.43 kg/m. 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 non specific st and t changes, done 08/28/23  Recent Labs: 10/26/2023: ALT 38; BUN 11; Creatinine, Ser 0.93; Hemoglobin 16.9; Platelets 134; Potassium 4.3; Sodium 141    Lipid Panel    Component Value Date/Time   CHOL 209 (H) 10/26/2023 0942   TRIG 96 10/26/2023 0942   HDL 72 10/26/2023 0942   CHOLHDL 2.9  10/26/2023 0942   CHOLHDL 7 09/27/2014 1121   VLDL 41.6 (H) 09/27/2014 1121   LDLCALC 120 (H) 10/26/2023 0942   LDLDIRECT 131.3 09/27/2014 1121      Other studies Reviewed: Additional studies/ records that were reviewed today include:  Review of the above records demonstrates:       No data to display            ASSESSMENT AND PLAN:    ICD-10-CM   1. 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  Patient has shortness of breath on minimal exertion which and in the setting of diabetes may be due to angina equivalent    2. Type 2 diabetes mellitus with other specified  complication, without long-term current use of insulin (HCC)  E11.69 PCV ECHOCARDIOGRAM COMPLETE    MYOCARDIAL PERFUSION IMAGING    amLODipine-benazepril (LOTREL) 10-20 MG capsule    rosuvastatin (CRESTOR) 10 MG tablet    3. Primary hypertension  I10 PCV ECHOCARDIOGRAM COMPLETE    MYOCARDIAL PERFUSION IMAGING    amLODipine-benazepril (LOTREL) 10-20 MG capsule    rosuvastatin (CRESTOR) 10 MG tablet    4. Mixed hyperlipidemia  E78.2 PCV ECHOCARDIOGRAM COMPLETE    MYOCARDIAL PERFUSION IMAGING    amLODipine-benazepril (LOTREL) 10-20 MG capsule    rosuvastatin (CRESTOR) 10 MG tablet    5. OSA (obstructive sleep apnea)  G47.33 PCV ECHOCARDIOGRAM COMPLETE    MYOCARDIAL PERFUSION IMAGING    amLODipine-benazepril (LOTREL) 10-20 MG capsule    rosuvastatin (CRESTOR) 10 MG tablet    6. 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 SOB (shortness of breath)  R06.02 PCV ECHOCARDIOGRAM COMPLETE    MYOCARDIAL PERFUSION IMAGING    amLODipine-benazepril (LOTREL) 10-20 MG capsule    rosuvastatin (CRESTOR) 10 MG tablet   Patient has shortness of breath on exertion and has probably angina equivalent and coronary artery disease with abnormal EKG       Problem List Items Addressed This Visit       Cardiovascular and Mediastinum   HTN (hypertension)   Relevant Medications   amLODipine-benazepril (LOTREL) 10-20 MG capsule   rosuvastatin (CRESTOR) 10 MG tablet   Other Relevant Orders   PCV ECHOCARDIOGRAM COMPLETE   MYOCARDIAL PERFUSION IMAGING     Respiratory   OSA (obstructive sleep apnea)   Relevant Medications   amLODipine-benazepril (LOTREL) 10-20 MG capsule   rosuvastatin (CRESTOR) 10 MG tablet   Other Relevant Orders   PCV ECHOCARDIOGRAM COMPLETE   MYOCARDIAL PERFUSION IMAGING      Endocrine   Diabetes mellitus (HCC)   Relevant Medications   amLODipine-benazepril (LOTREL) 10-20 MG capsule   rosuvastatin (CRESTOR) 10 MG tablet   Other Relevant Orders   PCV ECHOCARDIOGRAM COMPLETE   MYOCARDIAL PERFUSION IMAGING     Other   Abnormal EKG   Relevant Medications   amLODipine-benazepril (LOTREL) 10-20 MG capsule   rosuvastatin (CRESTOR) 10 MG tablet   Other Relevant Orders   PCV ECHOCARDIOGRAM COMPLETE   MYOCARDIAL PERFUSION IMAGING   Mixed hyperlipidemia   Relevant Medications   amLODipine-benazepril (LOTREL) 10-20 MG capsule   rosuvastatin (CRESTOR) 10  MG tablet   Other Relevant Orders   PCV ECHOCARDIOGRAM COMPLETE   MYOCARDIAL PERFUSION IMAGING   Other Visit Diagnoses       Anginal equivalent (HCC)    -  Primary   set up echo, stress test.  Patient has shortness of breath on minimal exertion which and in the setting of diabetes may be due to angina equivalent   Relevant Medications   amLODipine-benazepril (LOTREL) 10-20 MG capsule   rosuvastatin (CRESTOR) 10 MG tablet   Other Relevant Orders   PCV ECHOCARDIOGRAM COMPLETE   MYOCARDIAL PERFUSION IMAGING     SOB (shortness of breath)       Patient has shortness of breath on exertion and has probably angina equivalent and coronary artery disease with abnormal EKG   Relevant Medications   amLODipine-benazepril (LOTREL) 10-20 MG capsule   rosuvastatin (CRESTOR) 10 MG tablet   Other Relevant Orders   PCV ECHOCARDIOGRAM COMPLETE   MYOCARDIAL PERFUSION IMAGING          Disposition:   Return in about 3 weeks (around 11/23/2023) for echo, strwss test and f/u.    Total time spent: 50 minutes  Signed,  Adrian Blackwater, MD  11/02/2023 3:03 PM    [facility]

## 2023-11-11 ENCOUNTER — Ambulatory Visit (INDEPENDENT_AMBULATORY_CARE_PROVIDER_SITE_OTHER): Payer: 59

## 2023-11-11 DIAGNOSIS — I10 Essential (primary) hypertension: Secondary | ICD-10-CM

## 2023-11-11 DIAGNOSIS — I371 Nonrheumatic pulmonary valve insufficiency: Secondary | ICD-10-CM

## 2023-11-11 DIAGNOSIS — I2089 Other forms of angina pectoris: Secondary | ICD-10-CM

## 2023-11-11 DIAGNOSIS — G4733 Obstructive sleep apnea (adult) (pediatric): Secondary | ICD-10-CM

## 2023-11-11 DIAGNOSIS — I361 Nonrheumatic tricuspid (valve) insufficiency: Secondary | ICD-10-CM

## 2023-11-11 DIAGNOSIS — R0602 Shortness of breath: Secondary | ICD-10-CM

## 2023-11-11 DIAGNOSIS — R9431 Abnormal electrocardiogram [ECG] [EKG]: Secondary | ICD-10-CM

## 2023-11-11 DIAGNOSIS — I340 Nonrheumatic mitral (valve) insufficiency: Secondary | ICD-10-CM | POA: Diagnosis not present

## 2023-11-11 DIAGNOSIS — E782 Mixed hyperlipidemia: Secondary | ICD-10-CM

## 2023-11-11 DIAGNOSIS — E1169 Type 2 diabetes mellitus with other specified complication: Secondary | ICD-10-CM

## 2023-11-17 ENCOUNTER — Ambulatory Visit (INDEPENDENT_AMBULATORY_CARE_PROVIDER_SITE_OTHER): Payer: 59

## 2023-11-17 ENCOUNTER — Ambulatory Visit: Payer: 59 | Admitting: Cardiovascular Disease

## 2023-11-17 DIAGNOSIS — E1169 Type 2 diabetes mellitus with other specified complication: Secondary | ICD-10-CM

## 2023-11-17 DIAGNOSIS — G4733 Obstructive sleep apnea (adult) (pediatric): Secondary | ICD-10-CM

## 2023-11-17 DIAGNOSIS — R0602 Shortness of breath: Secondary | ICD-10-CM | POA: Diagnosis not present

## 2023-11-17 DIAGNOSIS — I2089 Other forms of angina pectoris: Secondary | ICD-10-CM

## 2023-11-17 DIAGNOSIS — R9431 Abnormal electrocardiogram [ECG] [EKG]: Secondary | ICD-10-CM

## 2023-11-17 DIAGNOSIS — E782 Mixed hyperlipidemia: Secondary | ICD-10-CM

## 2023-11-17 DIAGNOSIS — I10 Essential (primary) hypertension: Secondary | ICD-10-CM

## 2023-11-19 ENCOUNTER — Ambulatory Visit: Payer: 59 | Admitting: Cardiovascular Disease

## 2023-11-19 ENCOUNTER — Encounter: Payer: Self-pay | Admitting: Cardiovascular Disease

## 2023-11-19 VITALS — BP 138/87 | HR 106 | Ht 70.0 in | Wt 230.0 lb

## 2023-11-19 DIAGNOSIS — G4733 Obstructive sleep apnea (adult) (pediatric): Secondary | ICD-10-CM

## 2023-11-19 DIAGNOSIS — I340 Nonrheumatic mitral (valve) insufficiency: Secondary | ICD-10-CM

## 2023-11-19 DIAGNOSIS — E1169 Type 2 diabetes mellitus with other specified complication: Secondary | ICD-10-CM

## 2023-11-19 DIAGNOSIS — E782 Mixed hyperlipidemia: Secondary | ICD-10-CM | POA: Diagnosis not present

## 2023-11-19 DIAGNOSIS — R9431 Abnormal electrocardiogram [ECG] [EKG]: Secondary | ICD-10-CM

## 2023-11-19 DIAGNOSIS — I10 Essential (primary) hypertension: Secondary | ICD-10-CM | POA: Diagnosis not present

## 2023-11-19 DIAGNOSIS — I4711 Inappropriate sinus tachycardia, so stated: Secondary | ICD-10-CM

## 2023-11-19 MED ORDER — METOPROLOL SUCCINATE ER 50 MG PO TB24: 50.0000 mg | ORAL_TABLET | Freq: Every day | ORAL | 11 refills | Status: DC

## 2023-11-19 NOTE — Progress Notes (Signed)
 Cardiology Office Note   Date:  11/19/2023   ID:  Brian Watson, Brian Watson [birth year], MRN [id number]  PCP:  Albina GORMAN Dine, MD  Cardiologist:  Denyse Bathe, MD      History of Present Illness: Brian Watson is a 48 y.o. male who presents for  Chief Complaint  Patient presents with   Follow-up    Echo/ nst    Doing well      Past Medical History:  Diagnosis Date   Anxiety 03/09/2012   Diabetes type 2, uncontrolled 02/25/2013   Dyslipidemia    Generalized headaches    frequent   GERD (gastroesophageal reflux disease)    H/O Bell's palsy 2011   HTN (hypertension)    Obesity    Smoker    cutting back   Transaminitis      History reviewed. No pertinent surgical history.   Current Outpatient Medications  Medication Sig Dispense Refill   Accu-Chek Softclix Lancets lancets Use as instructed 100 each 12   amLODipine -benazepril  (LOTREL) 10-20 MG capsule Take 1 capsule by mouth daily. 30 capsule 11   glucose blood (ACCU-CHEK GUIDE TEST) test strip Use as instructed 100 each 12   metoprolol  succinate (TOPROL  XL) 50 MG 24 hr tablet Take 1 tablet (50 mg total) by mouth daily. Take with or immediately following a meal. 30 tablet 11   rosuvastatin  (CRESTOR ) 10 MG tablet Take 1 tablet (10 mg total) by mouth daily. 90 tablet 0   glucose blood (ONE TOUCH TEST STRIPS) test strip Use as instructed (Patient not taking: Reported on 07/13/2023) 100 each 12   No current facility-administered medications for this visit.    Allergies:   Patient has no known allergies.    Social History:   reports that he has been smoking cigarettes. He has a 7 pack-year smoking history. He has never used smokeless tobacco. He reports current alcohol use. He reports that he does not use drugs.   Family History:  family history includes Coronary artery disease in his maternal grandfather, paternal grandfather, and paternal uncle; Coronary artery disease (age of onset: 65) in his  father; Diabetes in his father; Hyperlipidemia in his father and mother; Hypertension in his father and mother.    ROS:     Review of Systems  Constitutional: Negative.   HENT: Negative.    Eyes: Negative.   Respiratory: Negative.    Gastrointestinal: Negative.   Genitourinary: Negative.   Musculoskeletal: Negative.   Skin: Negative.   Neurological: Negative.   Endo/Heme/Allergies: Negative.   Psychiatric/Behavioral: Negative.    All other systems reviewed and are negative.     All other systems are reviewed and negative.    PHYSICAL EXAM: VS:  BP 138/87   Pulse (!) 106   Ht 5' 10 (1.778 m)   Wt 230 lb (104.3 kg)   SpO2 100%   BMI 33.00 kg/m  , BMI Body mass index is 33 kg/m. Last weight:  Wt Readings from Last 3 Encounters:  11/19/23 230 lb (104.3 kg)  11/02/23 226 lb (102.5 kg)  10/30/23 230 lb 3.2 oz (104.4 kg)     Physical Exam Vitals reviewed.  Constitutional:      Appearance: Normal appearance. He is normal weight.  HENT:     Head: Normocephalic.     Nose: Nose normal.     Mouth/Throat:     Mouth: Mucous membranes are moist.  Eyes:     Pupils: Pupils are equal, round, and reactive to  light.  Cardiovascular:     Rate and Rhythm: Normal rate and regular rhythm.     Pulses: Normal pulses.     Heart sounds: Normal heart sounds.  Pulmonary:     Effort: Pulmonary effort is normal.  Abdominal:     General: Abdomen is flat. Bowel sounds are normal.  Musculoskeletal:        General: Normal range of motion.     Cervical back: Normal range of motion.  Skin:    General: Skin is warm.  Neurological:     General: No focal deficit present.     Mental Status: He is alert.  Psychiatric:        Mood and Affect: Mood normal.       EKG:   Recent Labs: 10/26/2023: ALT 38; BUN 11; Creatinine, Ser 0.93; Hemoglobin 16.9; Platelets 134; Potassium 4.3; Sodium 141    Lipid Panel    Component Value Date/Time   CHOL 209 (H) 10/26/2023 0942   TRIG 96  10/26/2023 0942   HDL 72 10/26/2023 0942   CHOLHDL 2.9 10/26/2023 0942   CHOLHDL 7 09/27/2014 1121   VLDL 41.6 (H) 09/27/2014 1121   LDLCALC 120 (H) 10/26/2023 0942   LDLDIRECT 131.3 09/27/2014 1121      Other studies Reviewed: Additional studies/ records that were reviewed today include:  Review of the above records demonstrates:       No data to display            ASSESSMENT AND PLAN:    ICD-10-CM   1. Primary hypertension  I10 metoprolol  succinate (TOPROL  XL) 50 MG 24 hr tablet   BP elevated with sinus tachycardia , add metoprolol  50 daily    2. Type 2 diabetes mellitus with other specified complication, without long-term current use of insulin  (HCC)  E11.69 metoprolol  succinate (TOPROL  XL) 50 MG 24 hr tablet    3. Mixed hyperlipidemia  E78.2 metoprolol  succinate (TOPROL  XL) 50 MG 24 hr tablet    4. OSA (obstructive sleep apnea)  G47.33 metoprolol  succinate (TOPROL  XL) 50 MG 24 hr tablet    5. Abnormal EKG  R94.31 metoprolol  succinate (TOPROL  XL) 50 MG 24 hr tablet   stress test was normal    6. Nonrheumatic mitral valve regurgitation  I34.0    Trace MR normal LVEF    7. Inappropriate sinus tachycardia (HCC)  I47.11    start metoprolol  50       Problem List Items Addressed This Visit       Cardiovascular and Mediastinum   HTN (hypertension) - Primary   Relevant Medications   metoprolol  succinate (TOPROL  XL) 50 MG 24 hr tablet     Respiratory   OSA (obstructive sleep apnea)   Relevant Medications   metoprolol  succinate (TOPROL  XL) 50 MG 24 hr tablet     Endocrine   Diabetes mellitus (HCC)   Relevant Medications   metoprolol  succinate (TOPROL  XL) 50 MG 24 hr tablet     Other   Abnormal EKG   Relevant Medications   metoprolol  succinate (TOPROL  XL) 50 MG 24 hr tablet   Mixed hyperlipidemia   Relevant Medications   metoprolol  succinate (TOPROL  XL) 50 MG 24 hr tablet   Other Visit Diagnoses       Nonrheumatic mitral valve regurgitation        Trace MR normal LVEF   Relevant Medications   metoprolol  succinate (TOPROL  XL) 50 MG 24 hr tablet     Inappropriate sinus tachycardia (HCC)  start metoprolol  50   Relevant Medications   metoprolol  succinate (TOPROL  XL) 50 MG 24 hr tablet          Disposition:   Return in about 4 weeks (around 12/17/2023).    Total time spent: 30 minutes  Signed,  Denyse Bathe, MD  11/19/2023 11:04 AM    [facility]

## 2023-12-21 ENCOUNTER — Encounter: Payer: Self-pay | Admitting: Cardiovascular Disease

## 2023-12-21 ENCOUNTER — Ambulatory Visit: Payer: 59 | Admitting: Cardiovascular Disease

## 2023-12-21 VITALS — BP 132/81 | HR 79 | Ht 70.0 in | Wt 222.0 lb

## 2023-12-21 DIAGNOSIS — I4711 Inappropriate sinus tachycardia, so stated: Secondary | ICD-10-CM

## 2023-12-21 DIAGNOSIS — I340 Nonrheumatic mitral (valve) insufficiency: Secondary | ICD-10-CM

## 2023-12-21 DIAGNOSIS — E1169 Type 2 diabetes mellitus with other specified complication: Secondary | ICD-10-CM

## 2023-12-21 DIAGNOSIS — E782 Mixed hyperlipidemia: Secondary | ICD-10-CM | POA: Diagnosis not present

## 2023-12-21 DIAGNOSIS — G4733 Obstructive sleep apnea (adult) (pediatric): Secondary | ICD-10-CM | POA: Diagnosis not present

## 2023-12-21 DIAGNOSIS — I10 Essential (primary) hypertension: Secondary | ICD-10-CM

## 2023-12-21 DIAGNOSIS — R0602 Shortness of breath: Secondary | ICD-10-CM

## 2023-12-21 NOTE — Progress Notes (Signed)
 Cardiology Office Note   Date:  12/21/2023   ID:  Norvin, Ohlin 14-Nov-1975, MRN [id number]  PCP:  Sherron Monday, MD  Cardiologist:  Adrian Blackwater, MD      History of Present Illness: Brian Watson is a 48 y.o. male who presents for  Chief Complaint  Patient presents with   Follow-up    4 week follow up     Feeeling better      Past Medical History:  Diagnosis Date   Anxiety 03/09/2012   Diabetes type 2, uncontrolled 02/25/2013   Dyslipidemia    Generalized headaches    frequent   GERD (gastroesophageal reflux disease)    H/O Bell's palsy 2011   HTN (hypertension)    Obesity    Smoker    cutting back   Transaminitis      History reviewed. No pertinent surgical history.   Current Outpatient Medications  Medication Sig Dispense Refill   Accu-Chek Softclix Lancets lancets Use as instructed 100 each 12   amLODipine-benazepril (LOTREL) 10-20 MG capsule Take 1 capsule by mouth daily. 30 capsule 11   glucose blood (ACCU-CHEK GUIDE TEST) test strip Use as instructed 100 each 12   glucose blood (ONE TOUCH TEST STRIPS) test strip Use as instructed (Patient not taking: Reported on 07/13/2023) 100 each 12   metoprolol succinate (TOPROL XL) 50 MG 24 hr tablet Take 1 tablet (50 mg total) by mouth daily. Take with or immediately following a meal. 30 tablet 11   rosuvastatin (CRESTOR) 10 MG tablet Take 1 tablet (10 mg total) by mouth daily. 90 tablet 0   No current facility-administered medications for this visit.    Allergies:   Patient has no known allergies.    Social History:   reports that he has been smoking cigarettes. He has a 7 pack-year smoking history. He has never used smokeless tobacco. He reports current alcohol use. He reports that he does not use drugs.   Family History:  family history includes Coronary artery disease in his maternal grandfather, paternal grandfather, and paternal uncle; Coronary artery disease (age of  onset: 2) in his father; Diabetes in his father; Hyperlipidemia in his father and mother; Hypertension in his father and mother.    ROS:     Review of Systems  Constitutional: Negative.   HENT: Negative.    Eyes: Negative.   Respiratory: Negative.    Gastrointestinal: Negative.   Genitourinary: Negative.   Musculoskeletal: Negative.   Skin: Negative.   Neurological: Negative.   Endo/Heme/Allergies: Negative.   Psychiatric/Behavioral: Negative.    All other systems reviewed and are negative.     All other systems are reviewed and negative.    PHYSICAL EXAM: VS:  BP 132/81   Pulse 79   Ht 5\' 10"  (1.778 m)   Wt 222 lb (100.7 kg)   SpO2 98%   BMI 31.85 kg/m  , BMI Body mass index is 31.85 kg/m. Last weight:  Wt Readings from Last 3 Encounters:  12/21/23 222 lb (100.7 kg)  11/19/23 230 lb (104.3 kg)  11/02/23 226 lb (102.5 kg)     Physical Exam Vitals reviewed.  Constitutional:      Appearance: Normal appearance. He is normal weight.  HENT:     Head: Normocephalic.     Nose: Nose normal.     Mouth/Throat:     Mouth: Mucous membranes are moist.  Eyes:     Pupils: Pupils are equal, round, and reactive to  light.  Cardiovascular:     Rate and Rhythm: Normal rate and regular rhythm.     Pulses: Normal pulses.     Heart sounds: Normal heart sounds.  Pulmonary:     Effort: Pulmonary effort is normal.  Abdominal:     General: Abdomen is flat. Bowel sounds are normal.  Musculoskeletal:        General: Normal range of motion.     Cervical back: Normal range of motion.  Skin:    General: Skin is warm.  Neurological:     General: No focal deficit present.     Mental Status: He is alert.  Psychiatric:        Mood and Affect: Mood normal.       EKG:   Recent Labs: 10/26/2023: ALT 38; BUN 11; Creatinine, Ser 0.93; Hemoglobin 16.9; Platelets 134; Potassium 4.3; Sodium 141    Lipid Panel    Component Value Date/Time   CHOL 209 (H) 10/26/2023 0942   TRIG 96  10/26/2023 0942   HDL 72 10/26/2023 0942   CHOLHDL 2.9 10/26/2023 0942   CHOLHDL 7 09/27/2014 1121   VLDL 41.6 (H) 09/27/2014 1121   LDLCALC 120 (H) 10/26/2023 0942   LDLDIRECT 131.3 09/27/2014 1121      Other studies Reviewed: Additional studies/ records that were reviewed today include:  Review of the above records demonstrates:       No data to display            ASSESSMENT AND PLAN:    ICD-10-CM   1. Mixed hyperlipidemia  E78.2     2. OSA (obstructive sleep apnea)  G47.33     3. Primary hypertension  I10     4. Nonrheumatic mitral valve regurgitation  I34.0     5. SOB (shortness of breath)  R06.02     6. Inappropriate sinus tachycardia (HCC)  I47.11    HR better 77, sleeping btter on metoprolol, with more energy       Problem List Items Addressed This Visit       Cardiovascular and Mediastinum   HTN (hypertension)     Respiratory   OSA (obstructive sleep apnea)     Other   Mixed hyperlipidemia - Primary   Other Visit Diagnoses       Nonrheumatic mitral valve regurgitation         SOB (shortness of breath)         Inappropriate sinus tachycardia (HCC)       HR better 77, sleeping btter on metoprolol, with more energy          Disposition:   Return in about 3 months (around 03/22/2024).    Total time spent: 30 minutes  Signed,  Adrian Blackwater, MD  12/21/2023 10:38 AM    [facility]

## 2024-01-27 ENCOUNTER — Telehealth: Payer: Self-pay | Admitting: Internal Medicine

## 2024-01-27 DIAGNOSIS — E1169 Type 2 diabetes mellitus with other specified complication: Secondary | ICD-10-CM

## 2024-01-27 DIAGNOSIS — R9431 Abnormal electrocardiogram [ECG] [EKG]: Secondary | ICD-10-CM

## 2024-01-27 DIAGNOSIS — R0602 Shortness of breath: Secondary | ICD-10-CM

## 2024-01-27 DIAGNOSIS — G4733 Obstructive sleep apnea (adult) (pediatric): Secondary | ICD-10-CM

## 2024-01-27 DIAGNOSIS — I10 Essential (primary) hypertension: Secondary | ICD-10-CM

## 2024-01-27 DIAGNOSIS — E782 Mixed hyperlipidemia: Secondary | ICD-10-CM

## 2024-01-27 DIAGNOSIS — I2089 Other forms of angina pectoris: Secondary | ICD-10-CM

## 2024-01-27 NOTE — Telephone Encounter (Signed)
 Refill request for CRESTOR 10mg  to [facility]

## 2024-01-28 MED ORDER — ROSUVASTATIN CALCIUM 10 MG PO TABS: 10.0000 mg | ORAL_TABLET | Freq: Every day | ORAL | 0 refills | Status: DC

## 2024-01-28 NOTE — Addendum Note (Signed)
 Addended byFrancie Irani on: 01/28/2024 11:08 AM   Modules accepted: Orders

## 2024-02-05 ENCOUNTER — Ambulatory Visit: Payer: 59 | Admitting: Internal Medicine

## 2024-02-05 VITALS — BP 116/68 | HR 79 | Temp 98.4°F | Ht 70.0 in | Wt 221.0 lb

## 2024-02-05 DIAGNOSIS — Z0130 Encounter for examination of blood pressure without abnormal findings: Secondary | ICD-10-CM

## 2024-02-05 DIAGNOSIS — E1169 Type 2 diabetes mellitus with other specified complication: Secondary | ICD-10-CM | POA: Diagnosis not present

## 2024-02-05 LAB — POC CREATINE & ALBUMIN,URINE
Albumin/Creatinine Ratio, Urine, POC: <30
Creatinine, POC: 200 mg/dL
Microalbumin Ur, POC: 30 mg/L

## 2024-02-05 LAB — HEMOGLOBIN A1C
Est. average glucose Bld gHb Est-mCnc: 120 mg/dL
Hgb A1c MFr Bld: 5.8 % — ABNORMAL HIGH (ref 4.8–5.6)

## 2024-02-05 LAB — POCT CBG (FASTING - GLUCOSE)-MANUAL ENTRY: Glucose Fasting, POC: 126 mg/dL — AB (ref 70–99)

## 2024-02-05 NOTE — Progress Notes (Signed)
 Established Patient Office Visit  Subjective:  Patient ID: Brian Watson, male    DOB: [birth year]  Age: 48 y.o. MRN: [id number]  No chief complaint on file.   No new complaints, here for lab review and medication refills. Failed to have previsit labs done. Home bg readings have been well controlled as he increased his exercise with 11 lb wt loss.     No other concerns at this time.   Past Medical History:  Diagnosis Date   Anxiety 03/09/2012   Diabetes type 2, uncontrolled 02/25/2013   Dyslipidemia    Generalized headaches    frequent   GERD (gastroesophageal reflux disease)    H/O [place] palsy 2011   HTN (hypertension)    Obesity    Smoker    cutting back   Transaminitis     No past surgical history on file.  Social History   Socioeconomic History   Marital status: Married    Spouse name: Not on file   Number of children: 2   Years of education: Not on file   Highest education level: Not on file  Occupational History   Occupation: GENERAL MANAGER    Employer: [employer]  Tobacco Use   Smoking status: Every Day    Current packs/day: 0.50    Average packs/day: 0.5 packs/day for 14.0 years (7.0 ttl pk-yrs)    Types: Cigarettes   Smokeless tobacco: Never  Vaping Use   Vaping status: Never Used  Substance and Sexual Activity   Alcohol use: Yes    Comment: Daily (2 drinks after work)   Drug use: No   Sexual activity: Yes  Other Topics Concern   Not on file  Social History Narrative   Caffeine: 1 cup coffee/day   Lives with wife and 2 children and mother, 1 dog   Occupation: self employed Holiday representative   Edu: HS   Activity: hunts, fishes, gardens   Diet: tries to keep good diet - daily fruits/vegetables, good water intake, at work poor diet.   Social Drivers of Corporate investment banker Strain: Not on file  Food Insecurity: Not on file  Transportation Needs: Not on file  Physical Activity: Not on file  Stress: Not on file   Social Connections: Not on file  Intimate Partner Violence: Not on file    Family History  Problem Relation Age of Onset   Hypertension Mother    Hyperlipidemia Mother    Diabetes Father    Hyperlipidemia Father    Hypertension Father    Coronary artery disease Father 46       smoking, severe diabetes   Coronary artery disease Paternal Uncle    Coronary artery disease Maternal Grandfather    Coronary artery disease Paternal Grandfather    Stroke Neg Hx    Cancer Neg Hx     No Known Allergies  Outpatient Medications Prior to Visit  Medication Sig   Accu-Chek Softclix Lancets lancets Use as instructed   amLODipine -benazepril  (LOTREL) 10-20 MG capsule Take 1 capsule by mouth daily.   glucose blood (ACCU-CHEK GUIDE TEST) test strip Use as instructed   metoprolol  succinate (TOPROL  XL) 50 MG 24 hr tablet Take 1 tablet (50 mg total) by mouth daily. Take with or immediately following a meal.   rosuvastatin  (CRESTOR ) 10 MG tablet Take 1 tablet (10 mg total) by mouth daily.   glucose blood (ONE TOUCH TEST STRIPS) test strip Use as instructed (Patient not taking: Reported on 07/13/2023)  No facility-administered medications prior to visit.    Review of Systems  Constitutional:  Positive for weight loss (11 lbs).  HENT: Negative.    Respiratory: Negative.    Cardiovascular: Negative.   Gastrointestinal: Negative.   Genitourinary: Negative.        Objective:   BP 116/68   Pulse 79   Temp 98.4 F (36.9 C) (Tympanic)   Ht 5\' 10"  (1.778 m)   Wt 221 lb (100.2 kg)   SpO2 97%   BMI 31.71 kg/m   Vitals:   02/05/24 1023  BP: 116/68  Pulse: 79  Temp: 98.4 F (36.9 C)  Height: 5\' 10"  (1.778 m)  Weight: 221 lb (100.2 kg)  SpO2: 97%  TempSrc: Tympanic  BMI (Calculated): 31.71    Physical Exam Vitals reviewed.  Constitutional:      Appearance: Normal appearance. He is obese.  HENT:     Head: Normocephalic.     Left Ear: There is no impacted cerumen.     Nose: Nose  normal.     Mouth/Throat:     Mouth: Mucous membranes are moist.     Pharynx: No posterior oropharyngeal erythema.  Eyes:     Extraocular Movements: Extraocular movements intact.     Pupils: Pupils are equal, round, and reactive to light.  Cardiovascular:     Rate and Rhythm: Regular rhythm.     Chest Wall: PMI is not displaced.     Pulses: Normal pulses.     Heart sounds: Normal heart sounds. No murmur heard. Pulmonary:     Effort: Pulmonary effort is normal.     Breath sounds: Normal air entry. No rales.  Abdominal:     General: Abdomen is protuberant. Bowel sounds are normal. There is no distension.     Palpations: Abdomen is soft. There is no hepatomegaly, splenomegaly or mass.     Tenderness: There is no abdominal tenderness.  Musculoskeletal:        General: Normal range of motion.     Cervical back: Normal range of motion and neck supple.     Right lower leg: No edema.     Left lower leg: No edema.  Skin:    General: Skin is warm and dry.  Neurological:     General: No focal deficit present.     Mental Status: He is alert and oriented to person, place, and time.     Cranial Nerves: No cranial nerve deficit.     Motor: No weakness.  Psychiatric:        Mood and Affect: Mood normal.        Behavior: Behavior normal.      Results for orders placed or performed in visit on 02/05/24  POCT CBG (Fasting - Glucose)  Result Value Ref Range   Glucose Fasting, POC 126 (A) 70 - 99 mg/dL  POC CREATINE & ALBUMIN,URINE  Result Value Ref Range   Microalbumin Ur, POC 30 mg/L   Creatinine, POC 200 mg/dL   Albumin/Creatinine Ratio, Urine, POC <30     Recent Results (from the past 2160 hours)  POCT CBG (Fasting - Glucose)     Status: Abnormal   Collection Time: 02/05/24 10:28 AM  Result Value Ref Range   Glucose Fasting, POC 126 (A) 70 - 99 mg/dL  POC CREATINE & ALBUMIN,URINE     Status: Normal   Collection Time: 02/05/24 11:36 AM  Result Value Ref Range   Microalbumin Ur,  POC 30 mg/L   Creatinine, POC  200 mg/dL   Albumin/Creatinine Ratio, Urine, POC <30       Assessment & Plan:  As per problem list  Problem List Items Addressed This Visit       Endocrine   Diabetes mellitus (HCC) - Primary   Relevant Orders   POCT CBG (Fasting - Glucose) (Completed)   POC CREATINE & ALBUMIN,URINE (Completed)    Return in about 3 months (around 05/06/2024) for fu with labs prior.   Total time spent: 20 minutes  Arzella Bitters, MD  02/05/2024   This document may have been prepared by [employer] Voice Recognition software and as such may include unintentional dictation errors.

## 2024-02-08 ENCOUNTER — Encounter: Payer: Self-pay | Admitting: Internal Medicine

## 2024-03-21 ENCOUNTER — Ambulatory Visit: Admitting: Cardiovascular Disease

## 2024-03-21 ENCOUNTER — Encounter: Payer: Self-pay | Admitting: Cardiovascular Disease

## 2024-03-21 VITALS — BP 112/68 | HR 74 | Ht 70.0 in | Wt 230.0 lb

## 2024-03-21 DIAGNOSIS — R9431 Abnormal electrocardiogram [ECG] [EKG]: Secondary | ICD-10-CM

## 2024-03-21 DIAGNOSIS — R0602 Shortness of breath: Secondary | ICD-10-CM | POA: Diagnosis not present

## 2024-03-21 DIAGNOSIS — E1169 Type 2 diabetes mellitus with other specified complication: Secondary | ICD-10-CM

## 2024-03-21 DIAGNOSIS — I4711 Inappropriate sinus tachycardia, so stated: Secondary | ICD-10-CM

## 2024-03-21 DIAGNOSIS — E782 Mixed hyperlipidemia: Secondary | ICD-10-CM

## 2024-03-21 DIAGNOSIS — G4733 Obstructive sleep apnea (adult) (pediatric): Secondary | ICD-10-CM | POA: Diagnosis not present

## 2024-03-21 DIAGNOSIS — I10 Essential (primary) hypertension: Secondary | ICD-10-CM

## 2024-03-21 DIAGNOSIS — I2089 Other forms of angina pectoris: Secondary | ICD-10-CM

## 2024-03-21 DIAGNOSIS — I340 Nonrheumatic mitral (valve) insufficiency: Secondary | ICD-10-CM

## 2024-03-21 MED ORDER — METOPROLOL SUCCINATE ER 50 MG PO TB24: 50.0000 mg | ORAL_TABLET | Freq: Every day | ORAL | 11 refills | Status: DC

## 2024-03-21 MED ORDER — AMLODIPINE BESY-BENAZEPRIL HCL 10-20 MG PO CAPS: 1.0000 | ORAL_CAPSULE | Freq: Every day | ORAL | 11 refills | Status: DC

## 2024-03-21 MED ORDER — ROSUVASTATIN CALCIUM 10 MG PO TABS: 10.0000 mg | ORAL_TABLET | Freq: Every day | ORAL | 0 refills | Status: DC

## 2024-03-21 NOTE — Progress Notes (Signed)
 Cardiology Office Note   Date:  03/21/2024   ID:  Brian Watson, DOB 30-Aug-1976, MRN [id number]  PCP:  Shari Daughters, MD  Cardiologist:  Debborah Fairly, MD      History of Present Illness: Brian Watson is a 48 y.o. male who presents for  Chief Complaint  Patient presents with   Follow-up    3 month follow up    Doing well, no chest pain or SOB      Past Medical History:  Diagnosis Date   Anxiety 03/09/2012   Diabetes type 2, uncontrolled 02/25/2013   Dyslipidemia    Generalized headaches    frequent   GERD (gastroesophageal reflux disease)    H/O Bell's palsy 2011   HTN (hypertension)    Obesity    Smoker    cutting back   Transaminitis      History reviewed. No pertinent surgical history.   Current Outpatient Medications  Medication Sig Dispense Refill   Accu-Chek Softclix Lancets lancets Use as instructed 100 each 12   amLODipine -benazepril  (LOTREL) 10-20 MG capsule Take 1 capsule by mouth daily. 30 capsule 11   glucose blood (ACCU-CHEK GUIDE TEST) test strip Use as instructed 100 each 12   glucose blood (ONE TOUCH TEST STRIPS) test strip Use as instructed (Patient not taking: Reported on 07/13/2023) 100 each 12   metoprolol  succinate (TOPROL  XL) 50 MG 24 hr tablet Take 1 tablet (50 mg total) by mouth daily. Take with or immediately following a meal. 30 tablet 11   rosuvastatin  (CRESTOR ) 10 MG tablet Take 1 tablet (10 mg total) by mouth daily. 90 tablet 0   No current facility-administered medications for this visit.    Allergies:   Patient has no known allergies.    Social History:   reports that he has been smoking cigarettes. He has a 7 pack-year smoking history. He has never used smokeless tobacco. He reports current alcohol use. He reports that he does not use drugs.   Family History:  family history includes Coronary artery disease in his maternal grandfather, paternal grandfather, and paternal uncle; Coronary artery  disease (age of onset: 32) in his father; Diabetes in his father; Hyperlipidemia in his father and mother; Hypertension in his father and mother.    ROS:     Review of Systems  Constitutional: Negative.   HENT: Negative.    Eyes: Negative.   Respiratory: Negative.    Gastrointestinal: Negative.   Genitourinary: Negative.   Musculoskeletal: Negative.   Skin: Negative.   Neurological: Negative.   Endo/Heme/Allergies: Negative.   Psychiatric/Behavioral: Negative.    All other systems reviewed and are negative.     All other systems are reviewed and negative.    PHYSICAL EXAM: VS:  BP 112/68   Pulse 74   Ht 5\' 10"  (1.778 m)   Wt 230 lb (104.3 kg)   SpO2 95%   BMI 33.00 kg/m  , BMI Body mass index is 33 kg/m. Last weight:  Wt Readings from Last 3 Encounters:  03/21/24 230 lb (104.3 kg)  02/05/24 221 lb (100.2 kg)  12/21/23 222 lb (100.7 kg)     Physical Exam Vitals reviewed.  Constitutional:      Appearance: Normal appearance. He is normal weight.  HENT:     Head: Normocephalic.     Nose: Nose normal.     Mouth/Throat:     Mouth: Mucous membranes are moist.  Eyes:     Pupils: Pupils are equal,  round, and reactive to light.  Cardiovascular:     Rate and Rhythm: Normal rate and regular rhythm.     Pulses: Normal pulses.     Heart sounds: Normal heart sounds.  Pulmonary:     Effort: Pulmonary effort is normal.  Abdominal:     General: Abdomen is flat. Bowel sounds are normal.  Musculoskeletal:        General: Normal range of motion.     Cervical back: Normal range of motion.  Skin:    General: Skin is warm.  Neurological:     General: No focal deficit present.     Mental Status: He is alert.  Psychiatric:        Mood and Affect: Mood normal.       EKG:   Recent Labs: 10/26/2023: ALT 38; BUN 11; Creatinine, Ser 0.93; Hemoglobin 16.9; Platelets 134; Potassium 4.3; Sodium 141    Lipid Panel    Component Value Date/Time   CHOL 209 (H) 10/26/2023  0942   TRIG 96 10/26/2023 0942   HDL 72 10/26/2023 0942   CHOLHDL 2.9 10/26/2023 0942   CHOLHDL 7 09/27/2014 1121   VLDL 41.6 (H) 09/27/2014 1121   LDLCALC 120 (H) 10/26/2023 0942   LDLDIRECT 131.3 09/27/2014 1121      Other studies Reviewed: Additional studies/ records that were reviewed today include:  Review of the above records demonstrates:       No data to display            ASSESSMENT AND PLAN:    ICD-10-CM   1. SOB (shortness of breath)  R06.02 rosuvastatin  (CRESTOR ) 10 MG tablet    amLODipine -benazepril  (LOTREL) 10-20 MG capsule    2. OSA (obstructive sleep apnea)  G47.33 rosuvastatin  (CRESTOR ) 10 MG tablet    metoprolol  succinate (TOPROL  XL) 50 MG 24 hr tablet    amLODipine -benazepril  (LOTREL) 10-20 MG capsule    3. Mixed hyperlipidemia  E78.2 rosuvastatin  (CRESTOR ) 10 MG tablet    metoprolol  succinate (TOPROL  XL) 50 MG 24 hr tablet    amLODipine -benazepril  (LOTREL) 10-20 MG capsule    4. Primary hypertension  I10 rosuvastatin  (CRESTOR ) 10 MG tablet    metoprolol  succinate (TOPROL  XL) 50 MG 24 hr tablet    amLODipine -benazepril  (LOTREL) 10-20 MG capsule    5. Nonrheumatic mitral valve regurgitation  I34.0     6. 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 Type 2 diabetes mellitus with other specified complication, without long-term current use of insulin  (HCC)  E11.69 rosuvastatin  (CRESTOR ) 10 MG tablet    metoprolol  succinate (TOPROL  XL) 50 MG 24 hr tablet    amLODipine -benazepril  (LOTREL) 10-20 MG capsule    8. Abnormal EKG  R94.31 rosuvastatin  (CRESTOR ) 10 MG tablet    metoprolol  succinate (TOPROL  XL) 50 MG 24 hr tablet    amLODipine -benazepril  (LOTREL) 10-20 MG capsule   EKG done 08/28/23 showed old IWMI, and ASWMI, advise echo stress test    9. SOB (shortness of breath)  R06.02 rosuvastatin  (CRESTOR ) 10 MG tablet    amLODipine -benazepril  (LOTREL) 10-20 MG capsule   Patient has shortness of breath on exertion and  has probably angina equivalent and coronary artery disease with abnormal EKG    10. 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  Patient has shortness of breath on minimal exertion which and in the setting  of diabetes may be due to angina equivalent    11. Primary hypertension  I10 rosuvastatin  (CRESTOR ) 10 MG tablet    metoprolol  succinate (TOPROL  XL) 50 MG 24 hr tablet    amLODipine -benazepril  (LOTREL) 10-20 MG capsule   BP elevated with sinus tachycardia , add metoprolol  50 daily    12. Abnormal EKG  R94.31 rosuvastatin  (CRESTOR ) 10 MG tablet    metoprolol  succinate (TOPROL  XL) 50 MG 24 hr tablet    amLODipine -benazepril  (LOTREL) 10-20 MG capsule   stress test was normal       Problem List Items Addressed This Visit       Cardiovascular and Mediastinum   HTN (hypertension)   Relevant Medications   rosuvastatin  (CRESTOR ) 10 MG tablet   metoprolol  succinate (TOPROL  XL) 50 MG 24 hr tablet   amLODipine -benazepril  (LOTREL) 10-20 MG capsule     Respiratory   OSA (obstructive sleep apnea)   Relevant Medications   rosuvastatin  (CRESTOR ) 10 MG tablet   metoprolol  succinate (TOPROL  XL) 50 MG 24 hr tablet   amLODipine -benazepril  (LOTREL) 10-20 MG capsule     Endocrine   Diabetes mellitus (HCC)   Relevant Medications   rosuvastatin  (CRESTOR ) 10 MG tablet   metoprolol  succinate (TOPROL  XL) 50 MG 24 hr tablet   amLODipine -benazepril  (LOTREL) 10-20 MG capsule     Other   Abnormal EKG   Relevant Medications   rosuvastatin  (CRESTOR ) 10 MG tablet   metoprolol  succinate (TOPROL  XL) 50 MG 24 hr tablet   amLODipine -benazepril  (LOTREL) 10-20 MG capsule   Mixed hyperlipidemia   Relevant Medications   rosuvastatin  (CRESTOR ) 10 MG tablet   metoprolol  succinate (TOPROL  XL) 50 MG 24 hr tablet   amLODipine -benazepril  (LOTREL) 10-20 MG capsule   Other Visit Diagnoses       SOB  (shortness of breath)    -  Primary   Relevant Medications   rosuvastatin  (CRESTOR ) 10 MG tablet   amLODipine -benazepril  (LOTREL) 10-20 MG capsule     Nonrheumatic mitral valve regurgitation       Relevant Medications   rosuvastatin  (CRESTOR ) 10 MG tablet   metoprolol  succinate (TOPROL  XL) 50 MG 24 hr tablet   amLODipine -benazepril  (LOTREL) 10-20 MG capsule     Inappropriate sinus tachycardia (HCC)       Improved HR now 74   Relevant Medications   rosuvastatin  (CRESTOR ) 10 MG tablet   metoprolol  succinate (TOPROL  XL) 50 MG 24 hr tablet   amLODipine -benazepril  (LOTREL) 10-20 MG capsule     SOB (shortness of breath)       Patient has shortness of breath on exertion and has probably angina equivalent and coronary artery disease with abnormal EKG   Relevant Medications   rosuvastatin  (CRESTOR ) 10 MG tablet   amLODipine -benazepril  (LOTREL) 10-20 MG capsule     Anginal equivalent (HCC)       set up echo, stress test.  Patient has shortness of breath on minimal exertion which and in the setting of diabetes may be due to angina equivalent   Relevant Medications   rosuvastatin  (CRESTOR ) 10 MG tablet   metoprolol  succinate (TOPROL  XL) 50 MG 24 hr tablet   amLODipine -benazepril  (LOTREL) 10-20 MG capsule          Disposition:   Return in about 3 months (around 06/21/2024).    Total time spent: 30 minutes  Signed,  Debborah Fairly, MD  03/21/2024 10:36 AM    [facility]

## 2024-05-09 ENCOUNTER — Ambulatory Visit: Admitting: Internal Medicine

## 2024-05-09 VITALS — BP 128/80 | HR 78 | Temp 99.0°F | Ht 70.0 in | Wt 226.0 lb

## 2024-05-09 DIAGNOSIS — E782 Mixed hyperlipidemia: Secondary | ICD-10-CM | POA: Diagnosis not present

## 2024-05-09 DIAGNOSIS — K219 Gastro-esophageal reflux disease without esophagitis: Secondary | ICD-10-CM | POA: Diagnosis not present

## 2024-05-09 DIAGNOSIS — I10 Essential (primary) hypertension: Secondary | ICD-10-CM

## 2024-05-09 DIAGNOSIS — E1169 Type 2 diabetes mellitus with other specified complication: Secondary | ICD-10-CM | POA: Diagnosis not present

## 2024-05-09 DIAGNOSIS — L80 Vitiligo: Secondary | ICD-10-CM | POA: Insufficient documentation

## 2024-05-09 LAB — POCT CBG (FASTING - GLUCOSE)-MANUAL ENTRY: Glucose Fasting, POC: 124 mg/dL — AB (ref 70–99)

## 2024-05-09 MED ORDER — PANTOPRAZOLE SODIUM 40 MG PO TBEC: 40.0000 mg | DELAYED_RELEASE_TABLET | Freq: Every day | ORAL | 2 refills | Status: DC

## 2024-05-09 NOTE — Progress Notes (Signed)
 Established Patient Office Visit  Subjective:  Patient ID: Brian Watson, male    DOB: 08-Oct-1976  Age: 48 y.o. MRN: [id number]  Chief Complaint  Patient presents with   Follow-up    3 months follow up    No new complaints, here for lab review and medication refills. Failed to have previsit labs done.      No other concerns at this time.   Past Medical History:  Diagnosis Date   Anxiety 03/09/2012   Diabetes type 2, uncontrolled 02/25/2013   Dyslipidemia    Generalized headaches    frequent   GERD (gastroesophageal reflux disease)    H/O Bell'Lenus Trauger palsy 2011   HTN (hypertension)    Obesity    Smoker    cutting back   Transaminitis     No past surgical history on file.  Social History   Socioeconomic History   Marital status: Married    Spouse name: Not on file   Number of children: 2   Years of education: Not on file   Highest education level: Not on file  Occupational History   Occupation: GENERAL MANAGER    Employer: [employer]  Tobacco Use   Smoking status: Every Day    Current packs/day: 0.50    Average packs/day: 0.5 packs/day for 14.0 years (7.0 ttl pk-yrs)    Types: Cigarettes   Smokeless tobacco: Never  Vaping Use   Vaping status: Never Used  Substance and Sexual Activity   Alcohol use: Yes    Comment: Daily (2 drinks after work)   Drug use: No   Sexual activity: Yes  Other Topics Concern   Not on file  Social History Narrative   Caffeine: 1 cup coffee/day   Lives with wife and 2 children and mother, 1 dog   Occupation: self employed Holiday representative   Edu: HS   Activity: hunts, fishes, gardens   Diet: tries to keep good diet - daily fruits/vegetables, good water intake, at work poor diet.   Social Drivers of Corporate investment banker Strain: Not on file  Food Insecurity: Not on file  Transportation Needs: Not on file  Physical Activity: Not on file  Stress: Not on file  Social Connections: Not on file   Intimate Partner Violence: Not on file    Family History  Problem Relation Age of Onset   Hypertension Mother    Hyperlipidemia Mother    Diabetes Father    Hyperlipidemia Father    Hypertension Father    Coronary artery disease Father 85       smoking, severe diabetes   Coronary artery disease Paternal Uncle    Coronary artery disease Maternal Grandfather    Coronary artery disease Paternal Grandfather    Stroke Neg Hx    Cancer Neg Hx     No Known Allergies  Outpatient Medications Prior to Visit  Medication Sig   Accu-Chek Softclix Lancets lancets Use as instructed   amLODipine -benazepril  (LOTREL) 10-20 MG capsule Take 1 capsule by mouth daily.   glucose blood (ACCU-CHEK GUIDE TEST) test strip Use as instructed   glucose blood (ONE TOUCH TEST STRIPS) test strip Use as instructed   metoprolol  succinate (TOPROL  XL) 50 MG 24 hr tablet Take 1 tablet (50 mg total) by mouth daily. Take with or immediately following a meal.   rosuvastatin  (CRESTOR ) 10 MG tablet Take 1 tablet (10 mg total) by mouth daily.   No facility-administered medications prior to visit.    Review  of Systems  Constitutional:  Positive for weight loss (5 lbs).  HENT: Negative.    Respiratory: Negative.    Cardiovascular: Negative.   Gastrointestinal: Negative.   Genitourinary: Negative.        Objective:   BP 128/80   Pulse 78   Temp 99 F (37.2 C) (Tympanic)   Ht 5' 10 (1.778 m)   Wt 226 lb (102.5 kg)   SpO2 96%   BMI 32.43 kg/m   Vitals:   05/09/24 1038  BP: 128/80  Pulse: 78  Temp: 99 F (37.2 C)  Height: 5' 10 (1.778 m)  Weight: 226 lb (102.5 kg)  SpO2: 96%  TempSrc: Tympanic  BMI (Calculated): 32.43    Physical Exam Vitals reviewed.  Constitutional:      Appearance: Normal appearance. He is obese.  HENT:     Head: Normocephalic.     Left Ear: There is no impacted cerumen.     Nose: Nose normal.     Mouth/Throat:     Mouth: Mucous membranes are moist.     Pharynx:  No posterior oropharyngeal erythema.  Eyes:     Extraocular Movements: Extraocular movements intact.     Pupils: Pupils are equal, round, and reactive to light.  Cardiovascular:     Rate and Rhythm: Regular rhythm.     Chest Wall: PMI is not displaced.     Pulses: Normal pulses.     Heart sounds: Normal heart sounds. No murmur heard. Pulmonary:     Effort: Pulmonary effort is normal.     Breath sounds: Normal air entry. No rales.  Abdominal:     General: Abdomen is protuberant. Bowel sounds are normal. There is no distension.     Palpations: Abdomen is soft. There is no hepatomegaly, splenomegaly or mass.     Tenderness: There is no abdominal tenderness.  Musculoskeletal:        General: Normal range of motion.     Cervical back: Normal range of motion and neck supple.     Right lower leg: No edema.     Left lower leg: No edema.  Skin:    General: Skin is warm and dry.  Neurological:     General: No focal deficit present.     Mental Status: He is alert and oriented to person, place, and time.     Cranial Nerves: No cranial nerve deficit.     Motor: No weakness.  Psychiatric:        Mood and Affect: Mood normal.        Behavior: Behavior normal.      Results for orders placed or performed in visit on 05/09/24  POCT CBG (Fasting - Glucose)  Result Value Ref Range   Glucose Fasting, POC 124 (A) 70 - 99 mg/dL    Recent Results (from the past 2160 hours)  POCT CBG (Fasting - Glucose)     Status: Abnormal   Collection Time: 05/09/24 10:44 AM  Result Value Ref Range   Glucose Fasting, POC 124 (A) 70 - 99 mg/dL      Assessment & Plan:  As per problem list. Labs released: A1c, lipids, cmp.  Problem List Items Addressed This Visit       Digestive   GERD (gastroesophageal reflux disease)   Relevant Medications   pantoprazole  (PROTONIX ) 40 MG tablet     Endocrine   Diabetes mellitus (HCC) - Primary   Relevant Orders   POCT CBG (Fasting - Glucose) (Completed)  Return in about 3 months (around 08/09/2024) for fu with labs prior.   Total time spent: 20 minutes  Sherrill Cinderella Perry, MD  05/09/2024   This document may have been prepared by [facility] Voice Recognition software and as such may include unintentional dictation errors.

## 2024-06-21 ENCOUNTER — Ambulatory Visit: Admitting: Cardiovascular Disease

## 2024-06-21 ENCOUNTER — Encounter: Payer: Self-pay | Admitting: Cardiovascular Disease

## 2024-06-21 VITALS — BP 126/70 | HR 74 | Ht 70.0 in | Wt 230.4 lb

## 2024-06-21 DIAGNOSIS — I340 Nonrheumatic mitral (valve) insufficiency: Secondary | ICD-10-CM | POA: Diagnosis not present

## 2024-06-21 DIAGNOSIS — I4711 Inappropriate sinus tachycardia, so stated: Secondary | ICD-10-CM

## 2024-06-21 DIAGNOSIS — E782 Mixed hyperlipidemia: Secondary | ICD-10-CM | POA: Diagnosis not present

## 2024-06-21 DIAGNOSIS — R0602 Shortness of breath: Secondary | ICD-10-CM | POA: Diagnosis not present

## 2024-06-21 DIAGNOSIS — G4733 Obstructive sleep apnea (adult) (pediatric): Secondary | ICD-10-CM

## 2024-06-21 NOTE — Progress Notes (Signed)
 Cardiology Office Note   Date:  06/21/2024   ID:  Brian Watson, DOB [birth year], MRN [id number]  PCP:  Albina GORMAN Dine, MD  Cardiologist:  Denyse Bathe, MD      History of Present Illness: Brian Watson is a 48 y.o. male who presents for  Chief Complaint  Patient presents with   Follow-up    3 month follow up    Feeling alright.      Past Medical History:  Diagnosis Date   Anxiety 03/09/2012   Diabetes type 2, uncontrolled 02/25/2013   Dyslipidemia    Generalized headaches    frequent   GERD (gastroesophageal reflux disease)    H/O Bell's palsy 2011   HTN (hypertension)    Obesity    Smoker    cutting back   Transaminitis      History reviewed. No pertinent surgical history.   Current Outpatient Medications  Medication Sig Dispense Refill   Accu-Chek Softclix Lancets lancets Use as instructed 100 each 12   amLODipine -benazepril  (LOTREL) 10-20 MG capsule Take 1 capsule by mouth daily. 30 capsule 11   glucose blood (ACCU-CHEK GUIDE TEST) test strip Use as instructed 100 each 12   glucose blood (ONE TOUCH TEST STRIPS) test strip Use as instructed 100 each 12   metoprolol  succinate (TOPROL  XL) 50 MG 24 hr tablet Take 1 tablet (50 mg total) by mouth daily. Take with or immediately following a meal. 30 tablet 11   pantoprazole  (PROTONIX ) 40 MG tablet Take 1 tablet (40 mg total) by mouth daily. 30 tablet 2   rosuvastatin  (CRESTOR ) 10 MG tablet Take 1 tablet (10 mg total) by mouth daily. 90 tablet 0   No current facility-administered medications for this visit.    Allergies:   Patient has no known allergies.    Social History:   reports that he has been smoking cigarettes. He has a 7 pack-year smoking history. He has never used smokeless tobacco. He reports current alcohol use. He reports that he does not use drugs.   Family History:  family history includes Coronary artery disease in his maternal grandfather, paternal grandfather, and  paternal uncle; Coronary artery disease (age of onset: 53) in his father; Diabetes in his father; Hyperlipidemia in his father and mother; Hypertension in his father and mother.    ROS:     Review of Systems  Constitutional: Negative.   HENT: Negative.    Eyes: Negative.   Respiratory: Negative.    Gastrointestinal: Negative.   Genitourinary: Negative.   Musculoskeletal: Negative.   Skin: Negative.   Neurological: Negative.   Endo/Heme/Allergies: Negative.   Psychiatric/Behavioral: Negative.    All other systems reviewed and are negative.     All other systems are reviewed and negative.    PHYSICAL EXAM: VS:  BP 126/70   Pulse 74   Ht 5' 10 (1.778 m)   Wt 230 lb 6.4 oz (104.5 kg)   SpO2 95%   BMI 33.06 kg/m  , BMI Body mass index is 33.06 kg/m. Last weight:  Wt Readings from Last 3 Encounters:  06/21/24 230 lb 6.4 oz (104.5 kg)  05/09/24 226 lb (102.5 kg)  03/21/24 230 lb (104.3 kg)     Physical Exam Vitals reviewed.  Constitutional:      Appearance: Normal appearance. He is normal weight.  HENT:     Head: Normocephalic.     Nose: Nose normal.     Mouth/Throat:     Mouth: Mucous membranes  are moist.  Eyes:     Pupils: Pupils are equal, round, and reactive to light.  Cardiovascular:     Rate and Rhythm: Normal rate and regular rhythm.     Pulses: Normal pulses.     Heart sounds: Normal heart sounds.  Pulmonary:     Effort: Pulmonary effort is normal.  Abdominal:     General: Abdomen is flat. Bowel sounds are normal.  Musculoskeletal:        General: Normal range of motion.     Cervical back: Normal range of motion.  Skin:    General: Skin is warm.  Neurological:     General: No focal deficit present.     Mental Status: He is alert.  Psychiatric:        Mood and Affect: Mood normal.       EKG:   Recent Labs: 10/26/2023: ALT 38; BUN 11; Creatinine, Ser 0.93; Hemoglobin 16.9; Platelets 134; Potassium 4.3; Sodium 141    Lipid Panel     Component Value Date/Time   CHOL 209 (H) 10/26/2023 0942   TRIG 96 10/26/2023 0942   HDL 72 10/26/2023 0942   CHOLHDL 2.9 10/26/2023 0942   CHOLHDL 7 09/27/2014 1121   VLDL 41.6 (H) 09/27/2014 1121   LDLCALC 120 (H) 10/26/2023 0942   LDLDIRECT 131.3 09/27/2014 1121      Other studies Reviewed: Additional studies/ records that were reviewed today include:  Review of the above records demonstrates:       No data to display            ASSESSMENT AND PLAN:    ICD-10-CM   1. SOB (shortness of breath)  R06.02    No SOB    2. Mixed hyperlipidemia  E78.2     3. Nonrheumatic mitral valve regurgitation  I34.0     4. 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 Keep dose at this level.    5. OSA (obstructive sleep apnea)  G47.33        Problem List Items Addressed This Visit       Respiratory   OSA (obstructive sleep apnea)     Other   Mixed hyperlipidemia   Other Visit Diagnoses       SOB (shortness of breath)    -  Primary   No SOB     Nonrheumatic mitral valve regurgitation         Inappropriate sinus tachycardia (HCC)       HR 74, on metoprolol  50. Keep dose at this level.          Disposition:   Return in about 3 months (around 09/20/2024).    Total time spent: 30 minutes  Signed,  Denyse Bathe, MD  06/21/2024 9:58 AM    [facility]

## 2024-08-04 ENCOUNTER — Other Ambulatory Visit: Payer: Self-pay | Admitting: Internal Medicine

## 2024-08-04 DIAGNOSIS — K219 Gastro-esophageal reflux disease without esophagitis: Secondary | ICD-10-CM

## 2024-08-22 ENCOUNTER — Ambulatory Visit: Admitting: Internal Medicine

## 2024-09-22 ENCOUNTER — Ambulatory Visit: Admitting: Cardiovascular Disease

## 2024-09-22 ENCOUNTER — Encounter: Payer: Self-pay | Admitting: Cardiovascular Disease

## 2024-09-22 VITALS — BP 123/78 | HR 80 | Ht 70.0 in | Wt 232.2 lb

## 2024-09-22 DIAGNOSIS — E782 Mixed hyperlipidemia: Secondary | ICD-10-CM

## 2024-09-22 DIAGNOSIS — I2089 Other forms of angina pectoris: Secondary | ICD-10-CM | POA: Diagnosis not present

## 2024-09-22 DIAGNOSIS — R9431 Abnormal electrocardiogram [ECG] [EKG]: Secondary | ICD-10-CM | POA: Diagnosis not present

## 2024-09-22 DIAGNOSIS — K219 Gastro-esophageal reflux disease without esophagitis: Secondary | ICD-10-CM

## 2024-09-22 DIAGNOSIS — E1169 Type 2 diabetes mellitus with other specified complication: Secondary | ICD-10-CM

## 2024-09-22 DIAGNOSIS — I10 Essential (primary) hypertension: Secondary | ICD-10-CM | POA: Diagnosis not present

## 2024-09-22 DIAGNOSIS — I4711 Inappropriate sinus tachycardia, so stated: Secondary | ICD-10-CM | POA: Diagnosis not present

## 2024-09-22 DIAGNOSIS — R0602 Shortness of breath: Secondary | ICD-10-CM

## 2024-09-22 DIAGNOSIS — G4733 Obstructive sleep apnea (adult) (pediatric): Secondary | ICD-10-CM

## 2024-09-22 DIAGNOSIS — I340 Nonrheumatic mitral (valve) insufficiency: Secondary | ICD-10-CM | POA: Diagnosis not present

## 2024-09-22 MED ORDER — METOPROLOL SUCCINATE ER 50 MG PO TB24: 50.0000 mg | ORAL_TABLET | Freq: Every day | ORAL | 2 refills | Status: AC

## 2024-09-22 MED ORDER — ROSUVASTATIN CALCIUM 10 MG PO TABS: 10.0000 mg | ORAL_TABLET | Freq: Every day | ORAL | 2 refills | Status: AC

## 2024-09-22 MED ORDER — AMLODIPINE BESY-BENAZEPRIL HCL 10-20 MG PO CAPS: 1.0000 | ORAL_CAPSULE | Freq: Every day | ORAL | 3 refills | Status: AC

## 2024-09-22 MED ORDER — PANTOPRAZOLE SODIUM 40 MG PO TBEC: 40.0000 mg | DELAYED_RELEASE_TABLET | Freq: Every day | ORAL | 2 refills | Status: AC

## 2024-09-22 NOTE — Progress Notes (Signed)
 Cardiology Office Note   Date:  09/22/2024   ID:  Brian, Watson 09-11-1976, MRN [id number]  PCP:  Albina GORMAN Dine, MD  Cardiologist:  Denyse Bathe, MD      History of Present Illness: Brian Watson is a 48 y.o. male who presents for  Chief Complaint  Patient presents with   Follow-up    3 month follow up    No chest pain or SOB.      Past Medical History:  Diagnosis Date   Anxiety 03/09/2012   Diabetes type 2, uncontrolled 02/25/2013   Dyslipidemia    Generalized headaches    frequent   GERD (gastroesophageal reflux disease)    H/O Bell's palsy 2011   HTN (hypertension)    Obesity    Smoker    cutting back   Transaminitis      History reviewed. No pertinent surgical history.   Current Outpatient Medications  Medication Sig Dispense Refill   Accu-Chek Softclix Lancets lancets Use as instructed 100 each 12   glucose blood (ACCU-CHEK GUIDE TEST) test strip Use as instructed 100 each 12   glucose blood (ONE TOUCH TEST STRIPS) test strip Use as instructed 100 each 12   amLODipine -benazepril  (LOTREL) 10-20 MG capsule Take 1 capsule by mouth daily. 90 capsule 3   metoprolol  succinate (TOPROL  XL) 50 MG 24 hr tablet Take 1 tablet (50 mg total) by mouth daily. Take with or immediately following a meal. 90 tablet 2   pantoprazole  (PROTONIX ) 40 MG tablet Take 1 tablet (40 mg total) by mouth daily. 90 tablet 2   rosuvastatin  (CRESTOR ) 10 MG tablet Take 1 tablet (10 mg total) by mouth daily. 90 tablet 2   No current facility-administered medications for this visit.    Allergies:   Patient has no known allergies.    Social History:   reports that he has been smoking cigarettes. He has a 7 pack-year smoking history. He has never used smokeless tobacco. He reports current alcohol use. He reports that he does not use drugs.   Family History:  family history includes Coronary artery disease in his maternal grandfather, paternal grandfather,  and paternal uncle; Coronary artery disease (age of onset: 9) in his father; Diabetes in his father; Hyperlipidemia in his father and mother; Hypertension in his father and mother.    ROS:     Review of Systems  Constitutional: Negative.   HENT: Negative.    Eyes: Negative.   Respiratory: Negative.    Gastrointestinal: Negative.   Genitourinary: Negative.   Musculoskeletal: Negative.   Skin: Negative.   Neurological: Negative.   Endo/Heme/Allergies: Negative.   Psychiatric/Behavioral: Negative.    All other systems reviewed and are negative.     All other systems are reviewed and negative.    PHYSICAL EXAM: VS:  BP 123/78   Pulse 80   Ht 5' 10 (1.778 m)   Wt 232 lb 3.2 oz (105.3 kg)   SpO2 96%   BMI 33.32 kg/m  , BMI Body mass index is 33.32 kg/m. Last weight:  Wt Readings from Last 3 Encounters:  09/22/24 232 lb 3.2 oz (105.3 kg)  06/21/24 230 lb 6.4 oz (104.5 kg)  05/09/24 226 lb (102.5 kg)     Physical Exam Vitals reviewed.  Constitutional:      Appearance: Normal appearance. He is normal weight.  HENT:     Head: Normocephalic.     Nose: Nose normal.     Mouth/Throat:  Mouth: Mucous membranes are moist.  Eyes:     Pupils: Pupils are equal, round, and reactive to light.  Cardiovascular:     Rate and Rhythm: Normal rate and regular rhythm.     Pulses: Normal pulses.     Heart sounds: Normal heart sounds.  Pulmonary:     Effort: Pulmonary effort is normal.  Abdominal:     General: Abdomen is flat. Bowel sounds are normal.  Musculoskeletal:        General: Normal range of motion.     Cervical back: Normal range of motion.  Skin:    General: Skin is warm.  Neurological:     General: No focal deficit present.     Mental Status: He is alert.  Psychiatric:        Mood and Affect: Mood normal.       EKG:   Recent Labs: 10/26/2023: ALT 38; BUN 11; Creatinine, Ser 0.93; Hemoglobin 16.9; Platelets 134; Potassium 4.3; Sodium 141    Lipid  Panel    Component Value Date/Time   CHOL 209 (H) 10/26/2023 0942   TRIG 96 10/26/2023 0942   HDL 72 10/26/2023 0942   CHOLHDL 2.9 10/26/2023 0942   CHOLHDL 7 09/27/2014 1121   VLDL 41.6 (H) 09/27/2014 1121   LDLCALC 120 (H) 10/26/2023 0942   LDLDIRECT 131.3 09/27/2014 1121      Other studies Reviewed: Additional studies/ records that were reviewed today include:  Review of the above records demonstrates:       No data to display            ASSESSMENT AND PLAN:    ICD-10-CM   1. SOB (shortness of breath)  R06.02 rosuvastatin  (CRESTOR ) 10 MG tablet    amLODipine -benazepril  (LOTREL) 10-20 MG capsule   Feels no longer SOB or tired.    2. Type 2 diabetes mellitus with other specified complication, without long-term current use of insulin  (HCC)  E11.69 rosuvastatin  (CRESTOR ) 10 MG tablet    metoprolol  succinate (TOPROL  XL) 50 MG 24 hr tablet    amLODipine -benazepril  (LOTREL) 10-20 MG capsule    3. Primary hypertension  I10 rosuvastatin  (CRESTOR ) 10 MG tablet    metoprolol  succinate (TOPROL  XL) 50 MG 24 hr tablet    amLODipine -benazepril  (LOTREL) 10-20 MG capsule    4. 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 Nonrheumatic mitral valve regurgitation  I34.0     7. Mixed hyperlipidemia  E78.2 rosuvastatin  (CRESTOR ) 10 MG tablet    metoprolol  succinate (TOPROL  XL) 50 MG 24 hr tablet    amLODipine -benazepril  (LOTREL) 10-20 MG capsule    8. Gastroesophageal reflux disease without esophagitis  K21.9 pantoprazole  (PROTONIX ) 40 MG tablet    9. 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  SOB (shortness of breath)  R06.02 rosuvastatin  (CRESTOR ) 10 MG tablet    amLODipine -benazepril  (LOTREL) 10-20 MG capsule   Patient has shortness of breath on exertion and has probably angina equivalent and coronary artery disease with abnormal EKG    11. 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  Patient has shortness of breath on minimal exertion which and in the setting of diabetes may be due to angina equivalent    12. Primary hypertension  I10 rosuvastatin  (CRESTOR ) 10 MG tablet    metoprolol  succinate (TOPROL  XL) 50 MG 24 hr tablet    amLODipine -benazepril  (LOTREL) 10-20 MG capsule   BP elevated with sinus tachycardia , add metoprolol  50 daily    13. Abnormal EKG  R94.31 rosuvastatin  (CRESTOR ) 10 MG tablet    metoprolol  succinate (TOPROL  XL) 50 MG 24 hr tablet    amLODipine -benazepril  (LOTREL) 10-20 MG capsule   stress test was normal       Problem List Items Addressed This Visit       Cardiovascular and Mediastinum   HTN (hypertension)   Relevant Medications   rosuvastatin  (CRESTOR ) 10 MG tablet   metoprolol  succinate (TOPROL  XL) 50 MG 24 hr tablet   amLODipine -benazepril  (LOTREL) 10-20 MG capsule     Respiratory   OSA (obstructive sleep apnea)   Relevant Medications   rosuvastatin  (CRESTOR ) 10 MG tablet   metoprolol  succinate (TOPROL  XL) 50 MG 24 hr tablet   amLODipine -benazepril  (LOTREL) 10-20 MG capsule     Digestive   GERD (gastroesophageal reflux disease)   Relevant Medications   pantoprazole  (PROTONIX ) 40 MG tablet     Endocrine   Diabetes mellitus (HCC)   Relevant Medications   rosuvastatin  (CRESTOR ) 10 MG tablet   metoprolol  succinate (TOPROL  XL) 50 MG 24 hr tablet   amLODipine -benazepril  (LOTREL) 10-20 MG capsule     Other   Abnormal EKG   Relevant Medications   rosuvastatin  (CRESTOR ) 10 MG tablet   metoprolol  succinate (TOPROL  XL) 50 MG 24 hr tablet    amLODipine -benazepril  (LOTREL) 10-20 MG capsule   Mixed hyperlipidemia   Relevant Medications   rosuvastatin  (CRESTOR ) 10 MG tablet   metoprolol  succinate (TOPROL  XL) 50 MG 24 hr tablet   amLODipine -benazepril  (LOTREL) 10-20 MG capsule   Other Visit Diagnoses       SOB (shortness of breath)    -  Primary   Feels no longer SOB or tired.   Relevant Medications   rosuvastatin  (CRESTOR ) 10 MG tablet   amLODipine -benazepril  (LOTREL) 10-20 MG capsule     Inappropriate sinus tachycardia       Relevant Medications   rosuvastatin  (CRESTOR ) 10 MG tablet   metoprolol  succinate (TOPROL  XL) 50 MG 24 hr tablet   amLODipine -benazepril  (LOTREL) 10-20 MG capsule     Nonrheumatic mitral valve regurgitation       Relevant Medications   rosuvastatin  (CRESTOR ) 10 MG tablet   metoprolol  succinate (TOPROL  XL) 50 MG 24 hr tablet   amLODipine -benazepril  (LOTREL) 10-20 MG capsule     SOB (shortness of breath)       Patient has shortness of breath on exertion and has probably angina equivalent and coronary artery disease with abnormal EKG   Relevant Medications   rosuvastatin  (CRESTOR ) 10 MG tablet   amLODipine -benazepril  (LOTREL) 10-20 MG capsule     Anginal equivalent       set up echo, stress test.  Patient has shortness of breath on minimal exertion which and in the setting of diabetes may be due to angina equivalent   Relevant Medications   rosuvastatin  (CRESTOR ) 10 MG tablet   amLODipine -benazepril  (LOTREL) 10-20 MG capsule          Disposition:   Return in  about 3 months (around 12/21/2024).    Total time spent: 40 minutes  Signed,  Denyse Bathe, MD  09/22/2024 9:44 AM    [facility]

## 2024-12-20 ENCOUNTER — Ambulatory Visit: Admitting: Cardiovascular Disease
